# Patient Record
Sex: Male | Born: 1955 | Race: White | Hispanic: No | Marital: Married | Smoking: Former smoker
Health system: Southern US, Community
[De-identification: ages and names within clinical notes are randomized; demographics above are authoritative.]

## PROBLEM LIST (undated history)

## (undated) DIAGNOSIS — N200 Calculus of kidney: Secondary | ICD-10-CM

## (undated) HISTORY — DX: Calculus of kidney: N20.0

## (undated) HISTORY — PX: OTHER SURGICAL HISTORY: SHX169

---

## 2002-05-07 ENCOUNTER — Emergency Department (HOSPITAL_COMMUNITY): Admission: EM | Admit: 2002-05-07 | Discharge: 2002-05-07 | Payer: Self-pay | Admitting: Emergency Medicine

## 2002-05-07 ENCOUNTER — Encounter: Payer: Self-pay | Admitting: Emergency Medicine

## 2007-10-01 ENCOUNTER — Encounter: Admission: RE | Admit: 2007-10-01 | Discharge: 2007-10-01 | Payer: Self-pay | Admitting: General Surgery

## 2008-03-10 ENCOUNTER — Ambulatory Visit: Payer: Self-pay | Admitting: Internal Medicine

## 2008-03-10 DIAGNOSIS — R1032 Left lower quadrant pain: Secondary | ICD-10-CM | POA: Insufficient documentation

## 2008-03-10 HISTORY — PX: FETAL SURGERY FOR CONGENITAL HERNIA: SHX1618

## 2008-03-10 LAB — CONVERTED CEMR LAB
Bacteria, UA: NEGATIVE
Bilirubin Urine: NEGATIVE
Crystals: NEGATIVE
Ketones, ur: NEGATIVE mg/dL
Leukocytes, UA: NEGATIVE
Nitrite: NEGATIVE
PSA: 0.56 ng/mL (ref 0.10–4.00)
Specific Gravity, Urine: 1.025 (ref 1.000–1.03)
Total Protein, Urine: NEGATIVE mg/dL
Urine Glucose: NEGATIVE mg/dL
Urobilinogen, UA: 0.2 (ref 0.0–1.0)
pH: 6 (ref 5.0–8.0)

## 2008-04-11 ENCOUNTER — Encounter: Payer: Self-pay | Admitting: Internal Medicine

## 2008-04-11 ENCOUNTER — Ambulatory Visit: Payer: Self-pay | Admitting: Internal Medicine

## 2008-04-11 DIAGNOSIS — N200 Calculus of kidney: Secondary | ICD-10-CM

## 2008-04-11 HISTORY — DX: Calculus of kidney: N20.0

## 2008-04-13 ENCOUNTER — Encounter: Payer: Self-pay | Admitting: Internal Medicine

## 2009-04-14 ENCOUNTER — Telehealth: Payer: Self-pay | Admitting: Internal Medicine

## 2009-04-18 ENCOUNTER — Ambulatory Visit: Payer: Self-pay | Admitting: Internal Medicine

## 2009-04-18 DIAGNOSIS — K219 Gastro-esophageal reflux disease without esophagitis: Secondary | ICD-10-CM | POA: Insufficient documentation

## 2009-04-18 DIAGNOSIS — R143 Flatulence: Secondary | ICD-10-CM

## 2009-04-18 DIAGNOSIS — R142 Eructation: Secondary | ICD-10-CM

## 2009-04-18 DIAGNOSIS — R1319 Other dysphagia: Secondary | ICD-10-CM | POA: Insufficient documentation

## 2009-04-18 DIAGNOSIS — R141 Gas pain: Secondary | ICD-10-CM | POA: Insufficient documentation

## 2009-04-24 ENCOUNTER — Telehealth: Payer: Self-pay | Admitting: Internal Medicine

## 2009-05-18 ENCOUNTER — Encounter: Payer: Self-pay | Admitting: Internal Medicine

## 2010-06-12 NOTE — Consult Note (Signed)
[facility]   Imported By: Sherian Rein 05/29/2009 10:01:54  _____________________________________________________________________  External Attachment:    Type:   Image     Comment:   External Document

## 2010-08-09 ENCOUNTER — Other Ambulatory Visit: Payer: Self-pay | Admitting: Pediatrics

## 2010-08-09 DIAGNOSIS — J331 Polypoid sinus degeneration: Secondary | ICD-10-CM

## 2010-08-10 ENCOUNTER — Ambulatory Visit
Admission: RE | Admit: 2010-08-10 | Discharge: 2010-08-10 | Disposition: A | Discharge status: Home / Self Care | Payer: 59 | Source: Ambulatory Visit | Attending: Pediatrics | Admitting: Pediatrics

## 2010-08-10 DIAGNOSIS — J331 Polypoid sinus degeneration: Secondary | ICD-10-CM

## 2010-08-15 ENCOUNTER — Other Ambulatory Visit: Payer: Self-pay | Admitting: Otolaryngology

## 2011-07-06 ENCOUNTER — Other Ambulatory Visit: Payer: Self-pay

## 2011-07-06 ENCOUNTER — Emergency Department (HOSPITAL_COMMUNITY)
Admission: EM | Admit: 2011-07-06 | Discharge: 2011-07-06 | Disposition: A | Discharge status: Home / Self Care | Payer: 59 | Attending: Emergency Medicine | Admitting: Emergency Medicine

## 2011-07-06 ENCOUNTER — Encounter (HOSPITAL_COMMUNITY): Payer: Self-pay | Admitting: Adult Health

## 2011-07-06 ENCOUNTER — Emergency Department (HOSPITAL_COMMUNITY): Payer: 59

## 2011-07-06 DIAGNOSIS — R0989 Other specified symptoms and signs involving the circulatory and respiratory systems: Secondary | ICD-10-CM | POA: Insufficient documentation

## 2011-07-06 DIAGNOSIS — R0602 Shortness of breath: Secondary | ICD-10-CM | POA: Insufficient documentation

## 2011-07-06 DIAGNOSIS — R002 Palpitations: Secondary | ICD-10-CM

## 2011-07-06 DIAGNOSIS — R0609 Other forms of dyspnea: Secondary | ICD-10-CM | POA: Insufficient documentation

## 2011-07-06 LAB — BASIC METABOLIC PANEL
BUN: 15 mg/dL (ref 6–23)
Calcium: 9.6 mg/dL (ref 8.4–10.5)
GFR calc Af Amer: >90 mL/min (ref >90)
GFR calc non Af Amer: >90 mL/min (ref >90)
Glucose, Bld: 105 mg/dL — ABNORMAL HIGH (ref 70–99)
Potassium: 3.5 mEq/L (ref 3.5–5.1)
Sodium: 138 mEq/L (ref 135–145)

## 2011-07-06 LAB — CBC
MCH: 30.3 pg (ref 26.0–34.0)
MCHC: 35 g/dL (ref 30.0–36.0)
RDW: 12.8 % (ref 11.5–15.5)

## 2011-07-06 LAB — POCT I-STAT TROPONIN I

## 2011-07-06 LAB — TROPONIN I: Troponin I: <0.3 ng/mL (ref <0.30)

## 2011-07-06 LAB — GLUCOSE, CAPILLARY: Glucose-Capillary: 104 mg/dL — ABNORMAL HIGH (ref 70–99)

## 2011-07-06 MED ORDER — ALBUTEROL SULFATE HFA 108 (90 BASE) MCG/ACT IN AERS
1.0000 | INHALATION_SPRAY | RESPIRATORY_TRACT | Status: DC
  Administered 2011-07-06: 2 via RESPIRATORY_TRACT
  Filled 2011-07-06: qty 6.7

## 2011-07-06 NOTE — ED Notes (Signed)
Patient transported to X-ray via stretcher accompanied by x-ray tech

## 2011-07-06 NOTE — ED Provider Notes (Signed)
History     CSN: [id number]  Arrival date & time 07/06/11  1455   First MD Initiated Contact with Patient 07/06/11 1500      Chief Complaint  Patient presents with  . Shortness of Breath     HPI Patient presents following episode of shakiness, palpitations, mild dyspnea.  He notes that over the past month he has had mild, persistent, productive cough with associated minimal dyspnea.  One week ago he had one episode that was similar to today's events.  That episode resolved spontaneously after a few moments.  Today he notes that he was walking, when he gradually became aware of palpitations, generalized sense of being unwell.  Concurrently he had mild dyspnea.  Symptoms resolved spontaneously after a few moments.  No exertional exacerbation, no pain, no near syncope, no vomiting, no ataxia, no weakness. The no fevers, no chills, no abdominal pain, no confusion, no lower extremity edema. History reviewed. No pertinent past medical history.  History reviewed. No pertinent past surgical history.  History reviewed. No pertinent family history.  History  Substance Use Topics  . Smoking status: Former Games developer  . Smokeless tobacco: Not on file  . Alcohol Use: No      Review of Systems  Constitutional:       Per HPI, otherwise negative  HENT:       Per HPI, otherwise negative  Eyes: Negative.   Respiratory:       Per HPI, otherwise negative  Cardiovascular:       Per HPI, otherwise negative  Gastrointestinal: Negative for vomiting.  Genitourinary: Negative.   Musculoskeletal:       Per HPI, otherwise negative  Skin: Negative.   Neurological: Negative for syncope.    Allergies  Review of patient's allergies indicates no known allergies.  Home Medications  No current outpatient prescriptions on file.  BP 149/89  Pulse 90  Temp 97.9 F (36.6 C)  Resp 22  Wt 180 lb (81.647 kg)  SpO2 99%  Physical Exam  Nursing note and vitals reviewed. Constitutional: He is  oriented to person, place, and time. He appears well-developed. No distress.  HENT:  Head: Normocephalic and atraumatic.  Eyes: Conjunctivae and EOM are normal.  Cardiovascular: Normal rate and regular rhythm.   Pulmonary/Chest: Effort normal. No stridor. No respiratory distress.  Abdominal: He exhibits no distension.  Musculoskeletal: He exhibits no edema.  Neurological: He is alert and oriented to person, place, and time.  Skin: Skin is warm and dry.  Psychiatric: He has a normal mood and affect.    ED Course  Procedures (including critical care time)  Labs Reviewed  GLUCOSE, CAPILLARY - Abnormal; Notable for the following:    Glucose-Capillary 104 (*)    All other components within normal limits  CBC  BASIC METABOLIC PANEL   No results found.   No diagnosis found.  Pulse oximetry 100% room air normal Monitor 85 sinus rhythm normal Chest x-ray was reviewed by me    Date: 07/06/2011  Rate: 91  Rhythm: normal sinus rhythm  QRS Axis: normal  Intervals: normal  ST/T Wave abnormalities: normal  Conduction Disutrbances:none  Narrative Interpretation:   Old EKG Reviewed: none available NORMAL  R/O ACS vs. PNA, low suspicion for PE.  Sx likely bronchitis  7:50 PM Patient w no new complaints.  Repeat Trop Negative. MDM  This generally well-appearing 56 year old male presents with renal episode of palpitations and generalized sense of being unwell.  Notably the patient has a history  of cough over the past month.  On exam he is in no distress with unremarkable vital signs.  The patient's denial of exertional or pleuritic pain, the absence of any signs of distress, or abnormal vital signs is a reassuring for the low probability of this being cardiac etiology.  Patient has no risk factors for PE and is not hypoxic.  Given the patient's age, he had serial troponins drawn, multiple negative results are concordant with the remainder of the patient's evaluation which is reassuring  for the very low suspicion of cardiac etiology.  Given the lifelong cough, there is poor suspicion of viral infection or bronchitis.  All findings were discussed with the patient and his wife.  The patient was discharged in stable condition to follow up with his primary care physician in 2 days        Gerhard Munch, MD 07/06/11 [id number]

## 2011-07-06 NOTE — Discharge Instructions (Signed)
Your emergency department evaluation tonight did not demonstrate a specific cause of your complaints.  Although your symptoms are most consistent with either a viral illness or bronchitis, there is need to continue for evaluation with your primary care physician.  Please make sure to call in 2 days to insure appropriate followup care and evaluation.   If you develop any new, or concerning changes in your condition prior to that evaluation, please return to emergency department immediately.

## 2011-07-06 NOTE — ED Notes (Signed)
While walking at [place] pt became shaky, began feeling short of breath and had an overall feeling that something was not right.  Denies chest pain. This has happened one week ago and resolved on own.

## 2011-07-06 NOTE — ED Notes (Signed)
Pt reports 4 week history of intermittent feelings of anxiety, cough with production of green sputum, and generally feeling unwell.  Reports waking up today with the same feelings, but more intense.  Reports the feelings subsided, he went out shopping with his wife and suddenly felt anxious and felt that he had to come to see a doctor right away.  Pt denies fever, denies pain, denies shortness of breath, no dizziness, no nausea, no diaphoresis

## 2011-07-06 NOTE — ED Notes (Signed)
MD at bedside. 

## 2011-07-06 NOTE — ED Notes (Signed)
Continuous cardiac monitor applied showing normal sinus rhythm, no ectopy Continuous pulse oximetry applied. Automatic BP cuff applied.  

## 2012-04-22 ENCOUNTER — Other Ambulatory Visit: Payer: Self-pay | Admitting: Neurosurgery

## 2012-04-22 DIAGNOSIS — M542 Cervicalgia: Secondary | ICD-10-CM

## 2012-04-22 DIAGNOSIS — M5410 Radiculopathy, site unspecified: Secondary | ICD-10-CM

## 2012-05-01 ENCOUNTER — Ambulatory Visit
Admission: RE | Admit: 2012-05-01 | Discharge: 2012-05-01 | Disposition: A | Discharge status: Home / Self Care | Payer: 59 | Source: Ambulatory Visit | Attending: Neurosurgery | Admitting: Neurosurgery

## 2012-05-01 VITALS — BP 157/92 | HR 105

## 2012-05-01 DIAGNOSIS — M5410 Radiculopathy, site unspecified: Secondary | ICD-10-CM

## 2012-05-01 DIAGNOSIS — M542 Cervicalgia: Secondary | ICD-10-CM

## 2012-05-01 MED ORDER — DIAZEPAM 5 MG PO TABS
10.0000 mg | ORAL_TABLET | Freq: Once | ORAL | Status: AC
  Administered 2012-05-01: 10 mg via ORAL

## 2012-05-01 MED ORDER — IOHEXOL 300 MG/ML  SOLN
10.0000 mL | Freq: Once | INTRAMUSCULAR | Status: AC | PRN
  Administered 2012-05-01: 10 mL via INTRATHECAL

## 2012-08-14 ENCOUNTER — Encounter: Payer: Self-pay | Admitting: *Deleted

## 2012-08-14 ENCOUNTER — Telehealth: Payer: Self-pay | Admitting: Internal Medicine

## 2012-08-14 NOTE — Telephone Encounter (Signed)
Pt states he has had some bright red blood. Pt concerned about this, scheduled pt to see Mike Gip PA Monday 08/17/12@1 :30pm. Pt will try some OTC hemorrhoid medications and call us back Monday and cancel the appt if he decides he does not need to be seen. Pt aware of appt date and time.

## 2012-08-17 ENCOUNTER — Ambulatory Visit: Payer: 59 | Admitting: Physician Assistant

## 2012-11-20 ENCOUNTER — Other Ambulatory Visit (INDEPENDENT_AMBULATORY_CARE_PROVIDER_SITE_OTHER): Payer: 59

## 2012-11-20 ENCOUNTER — Encounter: Payer: Self-pay | Admitting: Physician Assistant

## 2012-11-20 ENCOUNTER — Ambulatory Visit (INDEPENDENT_AMBULATORY_CARE_PROVIDER_SITE_OTHER): Payer: 59 | Admitting: Nurse Practitioner

## 2012-11-20 ENCOUNTER — Telehealth: Payer: Self-pay | Admitting: Internal Medicine

## 2012-11-20 ENCOUNTER — Encounter: Payer: Self-pay | Admitting: Nurse Practitioner

## 2012-11-20 VITALS — BP 130/80 | HR 88 | Ht 70.0 in | Wt 182.0 lb

## 2012-11-20 DIAGNOSIS — K649 Unspecified hemorrhoids: Secondary | ICD-10-CM | POA: Insufficient documentation

## 2012-11-20 DIAGNOSIS — R1319 Other dysphagia: Secondary | ICD-10-CM

## 2012-11-20 DIAGNOSIS — K625 Hemorrhage of anus and rectum: Secondary | ICD-10-CM | POA: Insufficient documentation

## 2012-11-20 DIAGNOSIS — R141 Gas pain: Secondary | ICD-10-CM

## 2012-11-20 DIAGNOSIS — R109 Unspecified abdominal pain: Secondary | ICD-10-CM | POA: Insufficient documentation

## 2012-11-20 LAB — CBC WITH DIFFERENTIAL/PLATELET
Basophils Absolute: 0 10*3/uL (ref 0.0–0.1)
Basophils Relative: 0.3 % (ref 0.0–3.0)
Eosinophils Absolute: 0.1 10*3/uL (ref 0.0–0.7)
Eosinophils Relative: 0.9 % (ref 0.0–5.0)
HCT: 45.5 % (ref 39.0–52.0)
Hemoglobin: 15.5 g/dL (ref 13.0–17.0)
Lymphocytes Relative: 17.1 % (ref 12.0–46.0)
Lymphs Abs: 1.7 10*3/uL (ref 0.7–4.0)
MCHC: 34 g/dL (ref 30.0–36.0)
MCV: 88.9 fl (ref 78.0–100.0)
Monocytes Absolute: 0.5 10*3/uL (ref 0.1–1.0)
Monocytes Relative: 5.3 % (ref 3.0–12.0)
Neutro Abs: 7.7 10*3/uL (ref 1.4–7.7)
Neutrophils Relative %: 76.4 % (ref 43.0–77.0)
Platelets: 252 10*3/uL (ref 150.0–400.0)
RBC: 5.12 Mil/uL (ref 4.22–5.81)
RDW: 13 % (ref 11.5–14.6)
WBC: 10 10*3/uL (ref 4.5–10.5)

## 2012-11-20 MED ORDER — OMEPRAZOLE 20 MG PO CPDR: 20.0000 mg | DELAYED_RELEASE_CAPSULE | Freq: Every day | ORAL | Status: DC

## 2012-11-20 MED ORDER — HYDROCORTISONE ACETATE 25 MG RE SUPP: 25.0000 mg | Freq: Two times a day (BID) | RECTAL | Status: DC

## 2012-11-20 NOTE — Patient Instructions (Addendum)
Please go to the basement level to have your labs drawn.  We will notify you with the test results. Discontinue Motrin for now. Begin Prilosec 30 minutes before breakfast and 30 minutes before dinner  Anusol HC suppositories one every night for 7 days  We made you an appointment with Dr. Marina Goodell on 12-16-2012 at 8:30 am. .  Call office ASAP for black stool, increasing abdominal pain, nausea/ vomiting.

## 2012-11-20 NOTE — Telephone Encounter (Signed)
Patient reports rectal bleeding and "irratable bowel".  He will come in and see Willette Cluster RNP today at 1:30

## 2012-11-20 NOTE — Progress Notes (Signed)
HPI :  Patient is a 57 year old male known to Dr. Marina Goodell. He has a family history colon cancer in his father. Patient's last colonoscopy was November 2009 done for abdominal pain, bloating and his family history. Findings included a colon polyp and small internal hemorrhoids. Polyp pathology compatible with hyperplastic polyp. Surveillance colonoscopy D. November 2014.  Patient presents today for evaluation of rectal bleeding. He has had bright red blood with bowel movements for several days despite Preparation H. In addition, patient mentions a two-week history of loading and they mid abdominal discomfort. He reports passage of a black stool a couple of weeks ago (no bismuth or iron). No nausea. He does take Motrin about twice a week. No PPIs. He occasionally has difficulty swallowing steak. He seems to do okay if steak is cut up into small pieces. Rarely has heartburn.  Past Medical History  Diagnosis Date  . Colon polyps 04/11/2008    Ileocecal valve and hyperplastic (2)  . Nephrolithiasis    Family History  Problem Relation Age of Onset  . Colon cancer Father   . Prostate cancer Father   . Cervical cancer Mother    History  Substance Use Topics  . Smoking status: Former Games developer  . Smokeless tobacco: Never Used  . Alcohol Use: Yes   No current outpatient prescriptions on file.   No current facility-administered medications for this visit.   No Known Allergies   Review of Systems: All systems reviewed and negative except where noted in HPI.   Physical Exam: BP 130/80  Pulse 88  Ht 5\' 10"  (1.778 m)  Wt 182 lb (82.555 kg)  BMI 26.11 kg/m2 Constitutional: Pleasant,well-developed, white male in no acute distress. HEENT: Normocephalic and atraumatic. Conjunctivae are normal. No scleral icterus. Neck supple.  Cardiovascular: Normal rate, regular rhythm.  Pulmonary/chest: Effort normal and breath sounds normal. No wheezing, rales or rhonchi. Abdominal: Soft, nondistended,  nontender. Bowel sounds active throughout. There are no masses palpable. No hepatomegaly. Rectal: Very scant amount of brown, Hemoccult-negative stool. Mildly inflamed internal and external hemorrhoids Extremities: no edema Lymphadenopathy: No cervical adenopathy noted. Neurological: Alert and oriented to person place and time. Skin: Skin is warm and dry. No rashes noted. Psychiatric: Normal mood and affect. Behavior is normal.   ASSESSMENT AND PLAN: 69. 57 year old male presenting with several day history of low volume rectal bleeding with bowel movements. Patient does have internal and external hemorrhoids on exam. Suspect bleeding is secondary to hemorrhoids and will treat with steroid suppositories at night for one week.  2. two-week history of abdominal bloating, vague mid abdominal discomfort. Patient reports passage of a black stool couple weeks good. He is Hemoccult-negative today, stool is brown. Patient does use NSAIDs twice a week. He does not take a PPI. For now patient will discontinue NSAIDs, start twice daily PPI. Obtain a CBC. Will call patient with test results. He will followup in clinic with myself or Dr. Marina Goodell in 2 weeks but knows to call in the interim for worsening abdominal pain, nausea / vomiting/ or black stools.   3. [facility] of colon cancer in father. Surveillance colonoscopy due this November. Marland Kitchen

## 2012-11-23 NOTE — Progress Notes (Signed)
Agree with initial assessment and plans 

## 2012-12-14 ENCOUNTER — Ambulatory Visit: Payer: 59 | Admitting: Internal Medicine

## 2012-12-16 ENCOUNTER — Ambulatory Visit: Payer: 59 | Admitting: Internal Medicine

## 2012-12-28 ENCOUNTER — Encounter: Payer: Self-pay | Admitting: Internal Medicine

## 2012-12-28 ENCOUNTER — Ambulatory Visit (INDEPENDENT_AMBULATORY_CARE_PROVIDER_SITE_OTHER): Payer: 59 | Admitting: Internal Medicine

## 2012-12-28 VITALS — BP 120/70 | HR 70 | Ht 70.0 in | Wt 190.4 lb

## 2012-12-28 DIAGNOSIS — R109 Unspecified abdominal pain: Secondary | ICD-10-CM

## 2012-12-28 DIAGNOSIS — Z800 Family history of malignant neoplasm of digestive organs: Secondary | ICD-10-CM

## 2012-12-28 DIAGNOSIS — K625 Hemorrhage of anus and rectum: Secondary | ICD-10-CM

## 2012-12-28 MED ORDER — HYDROCORTISONE ACETATE 25 MG RE SUPP: 25.0000 mg | Freq: Two times a day (BID) | RECTAL | Status: DC

## 2012-12-28 MED ORDER — MOVIPREP 100 G PO SOLR: 1.0000 | Freq: Once | ORAL | Status: DC

## 2012-12-28 NOTE — Patient Instructions (Addendum)
You have been scheduled for a colonoscopy with propofol. Please follow written instructions given to you at your visit today.  Please pick up your prep kit at the pharmacy within the next 1-3 days. If you use inhalers (even only as needed), please bring them with you on the day of your procedure. Your physician has requested that you go to www.startemmi.com and enter the access code given to you at your visit today. This web site gives a general overview about your procedure. However, you should still follow specific instructions given to you by our office regarding your preparation for the procedure.   We have sent the following medications to your pharmacy for you to pick up at your convenience:  Anusol suppositories

## 2012-12-28 NOTE — Progress Notes (Signed)
HISTORY OF PRESENT ILLNESS:  Brandon Woodward is a 57 y.o. male with no significant past medical history who presents today for followup. He was evaluated by the GI nurse practitioner 11/20/2012 for vague abdominal discomfort and minor rectal bleeding. He was found to have internal and external hemorrhoids which were treated with medicated suppositories. Within one week, his bleeding resolved without recurrence. As well, mild abdominal discomfort abated with out recurrence. He presents today for followup as recommended. He didn't have laboratories on July 11. Normal CBC with hemoglobin 15.5. Does have a family history of colon cancer in his father. Index colonoscopy in November 2014 was normal except for a diminutive hyperplastic polyp which was removed and internal hemorrhoids. He is now due for his repeat screening exam. He wishes to set that up as well.  REVIEW OF SYSTEMS:  All non-GI ROS negative   Past Medical History  Diagnosis Date  . Colon polyps 04/11/2008    Ileocecal valve and hyperplastic (2)  . Nephrolithiasis     Past Surgical History  Procedure Laterality Date  . 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 Nose polypectomy      Social History Brandon Woodward  reports that he has quit smoking. He has never used smokeless tobacco. He reports that  drinks alcohol. He reports that he does not use illicit drugs.  family history includes Cervical cancer in his mother; Colon cancer in his father; Prostate cancer in his father.  No Known Allergies     PHYSICAL EXAMINATION:  Vital signs: BP 120/70  Pulse 70  Ht 5\' 10"  (1.778 m)  Wt 190 lb 6.4 oz (86.365 kg)  BMI 27.32 kg/m2 General: Well-developed, well-nourished, no acute distress HEENT: Sclerae are anicteric, conjunctiva pink. Oral mucosa intact Abdomen: Not reexamined. Psychiatric: alert and oriented x3. Cooperative   ASSESSMENT:  #1. Minor rectal bleeding secondary to hemorrhoids.  Resolved with medicated suppositories. He requests medication refill for on demand use if needed #2. Transient vague abdominal discomfort. Resolved #3. Family history of colon cancer due for followup high-risk screening   PLAN:  #1. Refill Anusol suppositories #2. Schedule repeat screening colonoscopy.The nature of the procedure, as well as the risks, benefits, and alternatives were carefully and thoroughly reviewed with the patient. Ample time for discussion and questions allowed. The patient understood, was satisfied, and agreed to proceed. Movi prep prescribed. The patient instructed on its use

## 2013-03-18 ENCOUNTER — Other Ambulatory Visit: Payer: Self-pay

## 2013-04-02 ENCOUNTER — Encounter: Payer: 59 | Admitting: Internal Medicine

## 2013-04-02 ENCOUNTER — Ambulatory Visit (AMBULATORY_SURGERY_CENTER): Payer: 59 | Admitting: Internal Medicine

## 2013-04-02 ENCOUNTER — Encounter: Payer: Self-pay | Admitting: Internal Medicine

## 2013-04-02 VITALS — BP 134/89 | HR 77 | Temp 98.0°F | Resp 18 | Ht 70.0 in | Wt 190.0 lb

## 2013-04-02 DIAGNOSIS — R109 Unspecified abdominal pain: Secondary | ICD-10-CM

## 2013-04-02 DIAGNOSIS — Z800 Family history of malignant neoplasm of digestive organs: Secondary | ICD-10-CM

## 2013-04-02 DIAGNOSIS — Z1211 Encounter for screening for malignant neoplasm of colon: Secondary | ICD-10-CM

## 2013-04-02 DIAGNOSIS — D126 Benign neoplasm of colon, unspecified: Secondary | ICD-10-CM

## 2013-04-02 DIAGNOSIS — K625 Hemorrhage of anus and rectum: Secondary | ICD-10-CM

## 2013-04-02 MED ORDER — HYDROCORTISONE ACETATE 25 MG RE SUPP: 25.0000 mg | Freq: Two times a day (BID) | RECTAL | Status: DC

## 2013-04-02 MED ORDER — SODIUM CHLORIDE 0.9 % IV SOLN: 500.0000 mL | INTRAVENOUS | Status: DC

## 2013-04-02 NOTE — Progress Notes (Signed)
  [facility] Anesthesia Post-op Note  Patient: Brandon Woodward  Procedure(s) Performed: colonoscopy  Patient Location: [facility] - Recovery Area  Anesthesia Type: Deep Sedation/Propofol  Level of Consciousness: awake, oriented and patient cooperative  Airway and Oxygen Therapy: Patient Spontanous Breathing  Post-op Pain: none  Post-op Assessment:  Post-op Vital signs reviewed, Patient's Cardiovascular Status Stable, Respiratory Function Stable, Patent Airway, No signs of Nausea or vomiting and Pain level controlled  Post-op Vital Signs: Reviewed and stable  Complications: No apparent anesthesia complications  Bj Morlock E 8:46 AM

## 2013-04-02 NOTE — Op Note (Signed)
[facility] [address] [place], [address]   COLONOSCOPY PROCEDURE REPORT  PATIENT: Brandon, Woodward.  MR#: [id number] BIRTHDATE: Feb 01, 1956 , 57  yrs. old GENDER: Male ENDOSCOPIST: Roxy Cedar, MD REFERRED KG:[id number] Recall, PROCEDURE DATE:  04/02/2013 PROCEDURE:   Colonoscopy with snare polypectomy First Screening Colonoscopy - Avg.  risk and is 50 yrs.  old or older - No.  Prior Negative Screening - Now for repeat screening. Above average risk  History of Adenoma - Now for follow-up colonoscopy & has been > or = to 3 yrs.  N/A  Polyps Removed Today? Yes. ASA CLASS:   Class I INDICATIONS:Patient's immediate family history of colon cancer (Father 66's). Index exam 03-2008 (tiny hpp o/w normal). MEDICATIONS: MAC sedation, administered by CRNA and propofol (Diprivan) 200mg  IV  DESCRIPTION OF PROCEDURE:   After the risks benefits and alternatives of the procedure were thoroughly explained, informed consent was obtained.  A digital rectal exam revealed no abnormalities of the rectum.   The LB [id number]  endoscope was introduced through the anus and advanced to the cecum, which was identified by both the appendix and ileocecal valve. No adverse events experienced.   The quality of the prep was good, using MoviPrep  The instrument was then slowly withdrawn as the colon was fully examined.   COLON FINDINGS: A diminutive polyp was found in the ascending colon. A polypectomy was performed with a cold snare.  The resection was complete and the polyp tissue was completely retrieved.   The mucosa appeared normal in the terminal ileum.   The colon mucosa was otherwise normal.  Retroflexed views revealed internal hemorrhoids. The time to cecum=1 minutes 45 seconds.  Withdrawal time=10 minutes 28 seconds.  The scope was withdrawn and the procedure completed. COMPLICATIONS: There were no complications.  ENDOSCOPIC IMPRESSION: 1.   Diminutive polyp was  found in the ascending colon; polypectomy was performed with a cold snare 2.   Normal mucosa in the terminal ileum 3.   The colon mucosa was otherwise normal  RECOMMENDATIONS: 1. Follow up colonoscopy in 5 years   eSigned:  Roxy Cedar, MD 04/02/2013 8:49 AM   cc: The Patient    ; Dibas Koirala,MD

## 2013-04-02 NOTE — Progress Notes (Signed)
Called to room to assist during endoscopic procedure.  Patient ID and intended procedure confirmed with present staff. Received instructions for my participation in the procedure from the performing physician. ewm 

## 2013-04-02 NOTE — Progress Notes (Signed)
Patient did not experience any of the following events: a burn prior to discharge; a fall within the facility; wrong site/side/patient/procedure/implant event; or a hospital transfer or hospital admission upon discharge from the facility. (G8907) Patient did not have preoperative order for IV antibiotic SSI prophylaxis. (G8918)  

## 2013-04-02 NOTE — Patient Instructions (Signed)
Polyp Infornation sheet given.  Follow up Colonoscopy in 5 years.  YOU HAD AN ENDOSCOPIC PROCEDURE TODAY AT THE [facility]: Refer to the procedure report that was given to you for any specific questions about what was found during the examination.  If the procedure report does not answer your questions, please call your gastroenterologist to clarify.  If you requested that your care partner not be given the details of your procedure findings, then the procedure report has been included in a sealed envelope for you to review at your convenience later.  YOU SHOULD EXPECT: Some feelings of bloating in the abdomen. Passage of more gas than usual.  Walking can help get rid of the air that was put into your GI tract during the procedure and reduce the bloating. If you had a lower endoscopy (such as a colonoscopy or flexible sigmoidoscopy) you may notice spotting of blood in your stool or on the toilet paper. If you underwent a bowel prep for your procedure, then you may not have a normal bowel movement for a few days.  DIET: Your first meal following the procedure should be a light meal and then it is ok to progress to your normal diet.  A half-sandwich or bowl of soup is an example of a good first meal.  Heavy or fried foods are harder to digest and may make you feel nauseous or bloated.  Likewise meals heavy in dairy and vegetables can cause extra gas to form and this can also increase the bloating.  Drink plenty of fluids but you should avoid alcoholic beverages for 24 hours.  ACTIVITY: Your care partner should take you home directly after the procedure.  You should plan to take it easy, moving slowly for the rest of the day.  You can resume normal activity the day after the procedure however you should NOT DRIVE or use heavy machinery for 24 hours (because of the sedation medicines used during the test).    SYMPTOMS TO REPORT IMMEDIATELY: A gastroenterologist can be reached at any hour.   During normal business hours, 8:30 AM to 5:00 PM Monday through Friday, call [phone].  After hours and on weekends, please call the GI answering service at [phone] who will take a message and have the physician on call contact you.   Following lower endoscopy (colonoscopy or flexible sigmoidoscopy):  Excessive amounts of blood in the stool  Significant tenderness or worsening of abdominal pains  Swelling of the abdomen that is new, acute  Fever of 100F or higher  FOLLOW UP: If any biopsies were taken you will be contacted by phone or by letter within the next 1-3 weeks.  Call your gastroenterologist if you have not heard about the biopsies in 3 weeks.  Our staff will call the home number listed on your records the next business day following your procedure to check on you and address any questions or concerns that you may have at that time regarding the information given to you following your procedure. This is a courtesy call and so if there is no answer at the home number and we have not heard from you through the emergency physician on call, we will assume that you have returned to your regular daily activities without incident.  SIGNATURES/CONFIDENTIALITY: You and/or your care partner have signed paperwork which will be entered into your electronic medical record.  These signatures attest to the fact that that the information above on your After Visit Summary has been  reviewed and is understood.  Full responsibility of the confidentiality of this discharge information lies with you and/or your care-partner. 

## 2013-04-05 ENCOUNTER — Telehealth: Payer: Self-pay | Admitting: *Deleted

## 2013-04-05 NOTE — Telephone Encounter (Signed)
  Follow up Call-  Call back number 04/02/2013  Post procedure Call Back phone  # [phone]  Permission to leave phone message Yes     Patient questions:  Do you have a fever, pain , or abdominal swelling? no Pain Score  0 *  Have you tolerated food without any problems? yes  Have you been able to return to your normal activities? yes  Do you have any questions about your discharge instructions: Diet   no Medications  no Follow up visit  no  Do you have questions or concerns about your Care? no  Actions: * If pain score is 4 or above: No action needed, pain <4.

## 2013-04-06 ENCOUNTER — Encounter: Payer: Self-pay | Admitting: Internal Medicine

## 2014-01-03 ENCOUNTER — Other Ambulatory Visit: Payer: Self-pay | Admitting: Family Medicine

## 2014-01-03 DIAGNOSIS — IMO0002 Reserved for concepts with insufficient information to code with codable children: Secondary | ICD-10-CM

## 2014-01-05 ENCOUNTER — Ambulatory Visit
Admission: RE | Admit: 2014-01-05 | Discharge: 2014-01-05 | Disposition: A | Discharge status: Home / Self Care | Payer: 59 | Source: Ambulatory Visit | Attending: Family Medicine | Admitting: Family Medicine

## 2014-01-05 DIAGNOSIS — IMO0002 Reserved for concepts with insufficient information to code with codable children: Secondary | ICD-10-CM

## 2015-02-27 ENCOUNTER — Encounter: Payer: Self-pay | Admitting: Internal Medicine

## 2015-07-14 ENCOUNTER — Other Ambulatory Visit: Payer: Self-pay | Admitting: Internal Medicine

## 2016-07-30 DIAGNOSIS — M47812 Spondylosis without myelopathy or radiculopathy, cervical region: Secondary | ICD-10-CM | POA: Diagnosis not present

## 2016-11-05 DIAGNOSIS — M47812 Spondylosis without myelopathy or radiculopathy, cervical region: Secondary | ICD-10-CM | POA: Diagnosis not present

## 2017-02-21 DIAGNOSIS — H40013 Open angle with borderline findings, low risk, bilateral: Secondary | ICD-10-CM | POA: Diagnosis not present

## 2017-02-21 DIAGNOSIS — H2513 Age-related nuclear cataract, bilateral: Secondary | ICD-10-CM | POA: Diagnosis not present

## 2017-02-21 DIAGNOSIS — D3131 Benign neoplasm of right choroid: Secondary | ICD-10-CM | POA: Diagnosis not present

## 2017-03-13 DIAGNOSIS — J0100 Acute maxillary sinusitis, unspecified: Secondary | ICD-10-CM | POA: Diagnosis not present

## 2017-03-13 DIAGNOSIS — E7800 Pure hypercholesterolemia, unspecified: Secondary | ICD-10-CM | POA: Diagnosis not present

## 2017-03-13 DIAGNOSIS — Z0000 Encounter for general adult medical examination without abnormal findings: Secondary | ICD-10-CM | POA: Diagnosis not present

## 2017-06-09 DIAGNOSIS — L570 Actinic keratosis: Secondary | ICD-10-CM | POA: Diagnosis not present

## 2017-06-09 DIAGNOSIS — L821 Other seborrheic keratosis: Secondary | ICD-10-CM | POA: Diagnosis not present

## 2017-10-13 DIAGNOSIS — M47812 Spondylosis without myelopathy or radiculopathy, cervical region: Secondary | ICD-10-CM | POA: Diagnosis not present

## 2017-12-01 DIAGNOSIS — L570 Actinic keratosis: Secondary | ICD-10-CM | POA: Diagnosis not present

## 2017-12-01 DIAGNOSIS — L821 Other seborrheic keratosis: Secondary | ICD-10-CM | POA: Diagnosis not present

## 2017-12-01 DIAGNOSIS — L812 Freckles: Secondary | ICD-10-CM | POA: Diagnosis not present

## 2018-02-16 DIAGNOSIS — Z23 Encounter for immunization: Secondary | ICD-10-CM | POA: Diagnosis not present

## 2018-03-24 DIAGNOSIS — H2513 Age-related nuclear cataract, bilateral: Secondary | ICD-10-CM | POA: Diagnosis not present

## 2018-03-24 DIAGNOSIS — D3131 Benign neoplasm of right choroid: Secondary | ICD-10-CM | POA: Diagnosis not present

## 2018-03-24 DIAGNOSIS — H40013 Open angle with borderline findings, low risk, bilateral: Secondary | ICD-10-CM | POA: Diagnosis not present

## 2018-03-31 DIAGNOSIS — Z0000 Encounter for general adult medical examination without abnormal findings: Secondary | ICD-10-CM | POA: Diagnosis not present

## 2018-03-31 DIAGNOSIS — E7800 Pure hypercholesterolemia, unspecified: Secondary | ICD-10-CM | POA: Diagnosis not present

## 2018-03-31 DIAGNOSIS — I10 Essential (primary) hypertension: Secondary | ICD-10-CM | POA: Diagnosis not present

## 2018-04-24 DIAGNOSIS — M25561 Pain in right knee: Secondary | ICD-10-CM | POA: Diagnosis not present

## 2018-04-24 DIAGNOSIS — S83411A Sprain of medial collateral ligament of right knee, initial encounter: Secondary | ICD-10-CM | POA: Diagnosis not present

## 2018-05-03 ENCOUNTER — Encounter: Payer: Self-pay | Admitting: Internal Medicine

## 2018-09-08 DIAGNOSIS — M25561 Pain in right knee: Secondary | ICD-10-CM | POA: Diagnosis not present

## 2020-08-11 ENCOUNTER — Encounter: Payer: Self-pay | Admitting: Internal Medicine

## 2020-10-16 ENCOUNTER — Encounter: Payer: Self-pay | Admitting: Internal Medicine

## 2020-11-02 ENCOUNTER — Encounter: Payer: Self-pay | Admitting: Internal Medicine

## 2021-01-19 ENCOUNTER — Other Ambulatory Visit: Payer: Self-pay

## 2021-01-19 ENCOUNTER — Ambulatory Visit (AMBULATORY_SURGERY_CENTER): Payer: Self-pay | Admitting: *Deleted

## 2021-01-19 VITALS — Ht 70.0 in | Wt 191.0 lb

## 2021-01-19 DIAGNOSIS — Z86010 Personal history of colonic polyps: Secondary | ICD-10-CM

## 2021-01-19 DIAGNOSIS — Z800 Family history of malignant neoplasm of digestive organs: Secondary | ICD-10-CM

## 2021-01-19 MED ORDER — SUTAB 1479-225-188 MG PO TABS: 1.0000 | ORAL_TABLET | ORAL | 0 refills | Status: DC

## 2021-01-19 NOTE — Progress Notes (Signed)
Patient is here in-person for PV. Patient denies any allergies to eggs or soy. Patient denies any problems with anesthesia/sedation. Patient is not on any oxygen at home. Patient is not taking any diet/weight loss medications or blood thinners. Patient is aware of our care-partner policy and [id number] safety protocol.   EMMI education assigned to the patient for the procedure, sent to [place].   Patient is COVID-19 vaccinated.  sutab Prep Prescription coupon was given to the patient.

## 2021-01-24 ENCOUNTER — Encounter: Payer: Self-pay | Admitting: Internal Medicine

## 2021-02-02 ENCOUNTER — Encounter: Payer: Self-pay | Admitting: Internal Medicine

## 2021-02-02 ENCOUNTER — Other Ambulatory Visit: Payer: Self-pay

## 2021-02-02 ENCOUNTER — Ambulatory Visit (AMBULATORY_SURGERY_CENTER): Payer: 59 | Admitting: Internal Medicine

## 2021-02-02 VITALS — BP 129/74 | HR 72 | Temp 98.6°F | Resp 10 | Ht 70.0 in | Wt 191.0 lb

## 2021-02-02 DIAGNOSIS — Z86010 Personal history of colonic polyps: Secondary | ICD-10-CM | POA: Diagnosis present

## 2021-02-02 DIAGNOSIS — D120 Benign neoplasm of cecum: Secondary | ICD-10-CM

## 2021-02-02 DIAGNOSIS — K635 Polyp of colon: Secondary | ICD-10-CM

## 2021-02-02 DIAGNOSIS — D122 Benign neoplasm of ascending colon: Secondary | ICD-10-CM

## 2021-02-02 MED ORDER — SODIUM CHLORIDE 0.9 % IV SOLN: 500.0000 mL | Freq: Once | INTRAVENOUS | Status: DC

## 2021-02-02 NOTE — Op Note (Signed)
[name] Procedure Date: 02/02/2021 9:20 AM MRN: [id number] Endoscopist: Docia Chuck. Henrene Pastor , MD Age: 65 Referring MD:  Date of Birth: 17-Jun-1955 Gender: Male Account #: [id number] Procedure:                Colonoscopy with cold snare polypectomy x 4; with                            biopsy Indications:              Screening in patient at increased risk: Colorectal                            cancer in father before age 3. Previous                            examinations 2009, 2014 were both negative for                            neoplasia. Medicines:                Monitored Anesthesia Care Procedure:                Pre-Anesthesia Assessment:                           - Prior to the procedure, a History and Physical                            was performed, and patient medications and                            allergies were reviewed. The patient's tolerance of                            previous anesthesia was also reviewed. The risks                            and benefits of the procedure and the sedation                            options and risks were discussed with the patient.                            All questions were answered, and informed consent                            was obtained. Prior Anticoagulants: The patient has                            taken no previous anticoagulant or antiplatelet                            agents. ASA Grade Assessment: II - A patient with  mild systemic disease. After reviewing the risks                            and benefits, the patient was deemed in                            satisfactory condition to undergo the procedure.                           After obtaining informed consent, the colonoscope                            was passed under direct vision. Throughout the                            procedure, the patient's blood pressure, pulse, and                             oxygen saturations were monitored continuously. The                            Olympus CF-HQ190L [id number]) Colonoscope was                            introduced through the anus and advanced to the the                            cecum, identified by appendiceal orifice and                            ileocecal valve. The ileocecal valve, appendiceal                            orifice, and rectum were photographed. The quality                            of the bowel preparation was good. The colonoscopy                            was performed without difficulty. The patient                            tolerated the procedure well. The bowel preparation                            used was SUPREP via split dose instruction. Scope In: 9:41:51 AM Scope Out: 9:58:43 AM Scope Withdrawal Time: 0 hours 13 minutes 36 seconds  Total Procedure Duration: 0 hours 16 minutes 52 seconds  Findings:                 Four polyps were found in the ascending colon and                            cecum. The polyps were 1 to 4  mm in size. These                            polyps were removed with a cold snare. Resection                            and retrieval were complete.                           A 1 mm nodule was found in the ascending colon.                            Biopsies were taken with a cold forceps for                            histology.                           The exam was otherwise without abnormality on                            direct and retroflexion views. All internal                            hemorrhoids present. Complications:            No immediate complications. Estimated blood loss:                            None. Estimated Blood Loss:     Estimated blood loss: none. Impression:               - Four 1 to 4 mm polyps in the ascending colon and                            in the cecum, removed with a cold snare. Resected                            and retrieved.                            - One 1 mm nodule in the ascending colon. Biopsied.                           - The examination was otherwise normal on direct                            and retroflexion views. Recommendation:           - Repeat colonoscopy in 5 years for surveillance.                           - Patient has a contact number available for                            emergencies. The signs and symptoms of  potential                            delayed complications were discussed with the                            patient. Return to normal activities tomorrow.                            Written discharge instructions were provided to the                            patient.                           - Resume previous diet.                           - Continue present medications.                           - Await pathology results. Docia Chuck. Henrene Pastor, MD 02/02/2021 10:07:00 AM This report has been signed electronically.

## 2021-02-02 NOTE — Patient Instructions (Signed)
Handouts on hemorrhoids and polyps given to patient.  Await pathology results. Repeat colonoscopy in 5 years for surveillance.   YOU HAD AN ENDOSCOPIC PROCEDURE TODAY AT [facility]:   Refer to the procedure report that was given to you for any specific questions about what was found during the examination.  If the procedure report does not answer your questions, please call your gastroenterologist to clarify.  If you requested that your care partner not be given the details of your procedure findings, then the procedure report has been included in a sealed envelope for you to review at your convenience later.  YOU SHOULD EXPECT: Some feelings of bloating in the abdomen. Passage of more gas than usual.  Walking can help get rid of the air that was put into your GI tract during the procedure and reduce the bloating. If you had a lower endoscopy (such as a colonoscopy or flexible sigmoidoscopy) you may notice spotting of blood in your stool or on the toilet paper. If you underwent a bowel prep for your procedure, you may not have a normal bowel movement for a few days.  Please Note:  You might notice some irritation and congestion in your nose or some drainage.  This is from the oxygen used during your procedure.  There is no need for concern and it should clear up in a day or so.  SYMPTOMS TO REPORT IMMEDIATELY:  Following lower endoscopy (colonoscopy or flexible sigmoidoscopy):  Excessive amounts of blood in the stool  Significant tenderness or worsening of abdominal pains  Swelling of the abdomen that is new, acute  Fever of 100F or higher  For urgent or emergent issues, a gastroenterologist can be reached at any hour by calling [phone]. Do not use MyChart messaging for urgent concerns.    DIET:  We do recommend a small meal at first, but then you may proceed to your regular diet.  Drink plenty of fluids but you should avoid alcoholic beverages for 24  hours.  ACTIVITY:  You should plan to take it easy for the rest of today and you should NOT DRIVE or use heavy machinery until tomorrow (because of the sedation medicines used during the test).    FOLLOW UP: Our staff will call the number listed on your records 48-72 hours following your procedure to check on you and address any questions or concerns that you may have regarding the information given to you following your procedure. If we do not reach you, we will leave a message.  We will attempt to reach you two times.  During this call, we will ask if you have developed any symptoms of COVID 19. If you develop any symptoms (ie: fever, flu-like symptoms, shortness of breath, cough etc.) before then, please call [phone].  If you test positive for Covid 19 in the 2 weeks post procedure, please call and report this information to [place].    If any biopsies were taken you will be contacted by phone or by letter within the next 1-3 weeks.  Please call us at [phone] if you have not heard about the biopsies in 3 weeks.    SIGNATURES/CONFIDENTIALITY: You and/or your care partner have signed paperwork which will be entered into your electronic medical record.  These signatures attest to the fact that that the information above on your After Visit Summary has been reviewed and is understood.  Full responsibility of the confidentiality of this discharge information lies with you and/or your  care-partner.  

## 2021-02-02 NOTE — Progress Notes (Signed)
HISTORY OF PRESENT ILLNESS:  Brandon Woodward is a 65 y.o. male who presents today for follow-up screening colonoscopy due to family history of colon cancer in his father, listed age 32.  Previous examinations 2009 and 2014 were both negative for neoplasia.  He has no active GI symptoms  REVIEW OF SYSTEMS:  All non-GI ROS negative.  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 His smoking use included cigarettes. He has never used smokeless tobacco. He reports current alcohol use of about 4.0 standard drinks per week. He reports that he does not use drugs.  family history includes Cervical cancer in his mother; Colon cancer (age of onset: 70) in his father; Prostate cancer in his father.  No Known Allergies     PHYSICAL EXAMINATION:  Vital signs: BP 110/73   Pulse 77   Temp 98.6 F (37 C) (Temporal)   Resp 16   Ht 5\' 10"  (1.778 m)   Wt 191 lb (86.6 kg)   SpO2 97%   BMI 27.41 kg/m  General: Well-developed, well-nourished, no acute distress HEENT: Sclerae are anicteric, conjunctiva pink. Oral mucosa intact Lungs: Clear Heart: Regular Abdomen: soft, nontender, nondistended, no obvious ascites, no peritoneal signs, normal bowel sounds. No organomegaly. Extremities: No edema Psychiatric: alert and oriented x3. Cooperative     ASSESSMENT:  1.  High risk colorectal neoplasia screening due to family history   PLAN:   1.  Screening colonoscopy

## 2021-02-02 NOTE — Progress Notes (Signed)
PT taken to PACU. Monitors in place. VSS. Report given to RN. 

## 2021-02-02 NOTE — Progress Notes (Signed)
Pt's states no medical or surgical changes since previsit or office visit. 

## 2021-02-02 NOTE — Addendum Note (Signed)
Addended by: Arville Lime on: 02/02/2021 11:28 AM   Modules accepted: Orders

## 2021-02-02 NOTE — Progress Notes (Signed)
Called to room to assist during endoscopic procedure.  Patient ID and intended procedure confirmed with present staff. Received instructions for my participation in the procedure from the performing physician.  

## 2021-02-06 ENCOUNTER — Telehealth: Payer: Self-pay

## 2021-02-06 ENCOUNTER — Encounter: Payer: Self-pay | Admitting: Internal Medicine

## 2021-02-06 NOTE — Telephone Encounter (Signed)
  Follow up Call-  Call back number 02/02/2021  Post procedure Call Back phone  # [phone]  Permission to leave phone message Yes  Some recent data might be hidden     Patient questions:  Do you have a fever, pain , or abdominal swelling? No. Pain Score  0 *  Have you tolerated food without any problems? Yes.    Have you been able to return to your normal activities? Yes.    Do you have any questions about your discharge instructions: Diet   No. Medications  No. Follow up visit  No.  Do you have questions or concerns about your Care? No.  Actions: * If pain score is 4 or above: No action needed, pain <4.  Have you developed a fever since your procedure? no  2.   Have you had an respiratory symptoms (SOB or cough) since your procedure? no  3.   Have you tested positive for COVID 19 since your procedure no  4.   Have you had any family members/close contacts diagnosed with the COVID 19 since your procedure?  no   If yes to any of these questions please route to Joylene John, RN and Joella Prince, RN

## 2021-02-26 ENCOUNTER — Telehealth: Payer: Self-pay | Admitting: Internal Medicine

## 2021-02-26 NOTE — Telephone Encounter (Signed)
Yes, that is correct. Please set up EGD in [place].  Thanks [employer]. Dr.  Henrene Pastor

## 2021-02-26 NOTE — Telephone Encounter (Signed)
Pt states he has been having some difficulty with food feeling like it is getting caught in his throat. States he had a colon done 3 weeks ago and was told to call back if he wanted to have an EGD done. Pt would like to set this up. Please advise if ok for direct egd with dil in the [place].

## 2021-02-26 NOTE — Telephone Encounter (Signed)
Inbound call from patient stating he has been having trouble swallowing and is requesting to speak to a nurse.  Please advise.

## 2021-02-27 ENCOUNTER — Other Ambulatory Visit: Payer: Self-pay

## 2021-02-27 DIAGNOSIS — R1310 Dysphagia, unspecified: Secondary | ICD-10-CM

## 2021-02-27 NOTE — Telephone Encounter (Signed)
Pt scheduled for EGD in the [facility] 03/07/21. Pt to arrive here at 8:30am, appt at 9:30am. Pt knows to be NPO after midnight. Ambulatory referral in epic.

## 2021-02-28 ENCOUNTER — Encounter: Payer: Self-pay | Admitting: Internal Medicine

## 2021-03-07 ENCOUNTER — Encounter: Payer: Self-pay | Admitting: Internal Medicine

## 2021-03-07 ENCOUNTER — Other Ambulatory Visit: Payer: Self-pay

## 2021-03-07 ENCOUNTER — Ambulatory Visit (AMBULATORY_SURGERY_CENTER): Payer: 59 | Admitting: Internal Medicine

## 2021-03-07 VITALS — BP 128/74 | HR 75 | Temp 98.0°F | Resp 13 | Ht 70.0 in | Wt 190.0 lb

## 2021-03-07 DIAGNOSIS — D132 Benign neoplasm of duodenum: Secondary | ICD-10-CM | POA: Diagnosis not present

## 2021-03-07 DIAGNOSIS — K219 Gastro-esophageal reflux disease without esophagitis: Secondary | ICD-10-CM | POA: Diagnosis not present

## 2021-03-07 DIAGNOSIS — R1310 Dysphagia, unspecified: Secondary | ICD-10-CM

## 2021-03-07 DIAGNOSIS — K222 Esophageal obstruction: Secondary | ICD-10-CM

## 2021-03-07 MED ORDER — PANTOPRAZOLE SODIUM 40 MG PO TBEC: 40.0000 mg | DELAYED_RELEASE_TABLET | Freq: Every day | ORAL | 11 refills | Status: DC

## 2021-03-07 MED ORDER — SODIUM CHLORIDE 0.9 % IV SOLN: 500.0000 mL | Freq: Once | INTRAVENOUS | Status: DC

## 2021-03-07 NOTE — Progress Notes (Signed)
Called to room to assist during endoscopic procedure.  Patient ID and intended procedure confirmed with present staff. Received instructions for my participation in the procedure from the performing physician.  

## 2021-03-07 NOTE — Progress Notes (Signed)
Pt's states no medical or surgical changes since previsit or office visit.   VS taken by DT 

## 2021-03-07 NOTE — Progress Notes (Signed)
Sedate, gd SR, tolerated procedure well, VSS, report to RN 

## 2021-03-07 NOTE — Progress Notes (Signed)
GI PREPROCEDURE NOTE  Patient was seen February 02, 2021 for colonoscopy.  See that evaluation.  He complained of intermittent solid food dysphagia and was set up for upper endoscopy with possible esophageal dilation today.  No interval changes.

## 2021-03-07 NOTE — Patient Instructions (Signed)
Start Pantoprazole 40 mg daily. Follow-up biopsies Follow post dilation diet Office follow up with Dr. Henrene Pastor in 4-6 weeks.  YOU HAD AN ENDOSCOPIC PROCEDURE TODAY AT [facility]:   Refer to the procedure report that was given to you for any specific questions about what was found during the examination.  If the procedure report does not answer your questions, please call your gastroenterologist to clarify.  If you requested that your care partner not be given the details of your procedure findings, then the procedure report has been included in a sealed envelope for you to review at your convenience later.  YOU SHOULD EXPECT: Some feelings of bloating in the abdomen. Passage of more gas than usual.  Walking can help get rid of the air that was put into your GI tract during the procedure and reduce the bloating. If you had a lower endoscopy (such as a colonoscopy or flexible sigmoidoscopy) you may notice spotting of blood in your stool or on the toilet paper. If you underwent a bowel prep for your procedure, you may not have a normal bowel movement for a few days.  Please Note:  You might notice some irritation and congestion in your nose or some drainage.  This is from the oxygen used during your procedure.  There is no need for concern and it should clear up in a day or so.  SYMPTOMS TO REPORT IMMEDIATELY:   Following upper endoscopy (EGD)  Vomiting of blood or coffee ground material  New chest pain or pain under the shoulder blades  Painful or persistently difficult swallowing  New shortness of breath  Fever of 100F or higher  Black, tarry-looking stools  For urgent or emergent issues, a gastroenterologist can be reached at any hour by calling [phone]. Do not use MyChart messaging for urgent concerns.    DIET:  We do recommend a small meal at first, but then you may proceed to your regular diet.  Drink plenty of fluids but you should avoid alcoholic beverages for  24 hours.  ACTIVITY:  You should plan to take it easy for the rest of today and you should NOT DRIVE or use heavy machinery until tomorrow (because of the sedation medicines used during the test).    FOLLOW UP: Our staff will call the number listed on your records 48-72 hours following your procedure to check on you and address any questions or concerns that you may have regarding the information given to you following your procedure. If we do not reach you, we will leave a message.  We will attempt to reach you two times.  During this call, we will ask if you have developed any symptoms of COVID 19. If you develop any symptoms (ie: fever, flu-like symptoms, shortness of breath, cough etc.) before then, please call [phone].  If you test positive for Covid 19 in the 2 weeks post procedure, please call and report this information to [place].    If any biopsies were taken you will be contacted by phone or by letter within the next 1-3 weeks.  Please call us at [phone] if you have not heard about the biopsies in 3 weeks.    SIGNATURES/CONFIDENTIALITY: You and/or your care partner have signed paperwork which will be entered into your electronic medical record.  These signatures attest to the fact that that the information above on your After Visit Summary has been reviewed and is understood.  Full responsibility of the confidentiality of this discharge  information lies with you and/or your care-partner.

## 2021-03-07 NOTE — Op Note (Signed)
[facility] Patient Name: Brandon Woodward Procedure Date: 03/07/2021 9:52 AM MRN: [id number] Endoscopist: Docia Chuck. Henrene Pastor , MD Age: 65 Referring MD:  Date of Birth: Mar 18, 1956 Gender: Male Account #: [id number] Procedure:                Upper GI endoscopy with biopsies; submucosal                            injection; Maloney dilation 52F Indications:              Dysphagia Medicines:                Monitored Anesthesia Care Procedure:                Pre-Anesthesia Assessment:                           - Prior to the procedure, a History and Physical                            was performed, and patient medications and                            allergies were reviewed. The patient's tolerance of                            previous anesthesia was also reviewed. The risks                            and benefits of the procedure and the sedation                            options and risks were discussed with the patient.                            All questions were answered, and informed consent                            was obtained. Prior Anticoagulants: The patient has                            taken no previous anticoagulant or antiplatelet                            agents. ASA Grade Assessment: II - A patient with                            mild systemic disease. After reviewing the risks                            and benefits, the patient was deemed in                            satisfactory condition to undergo the procedure.  After obtaining informed consent, the endoscope was                            passed under direct vision. Throughout the                            procedure, the patient's blood pressure, pulse, and                            oxygen saturations were monitored continuously. The                            GIF [id number] was introduced through the                            mouth, and advanced to the second part of  duodenum.                            The upper GI endoscopy was accomplished without                            difficulty. The patient tolerated the procedure                            well. Scope In: Scope Out: Findings:                 The exam of the esophagus revealed large caliber                            ringing throughout.                           One benign-appearing, intrinsic moderate stenosis                            was found 38 cm from the incisors. There was                            associated edema. This stenosis measured 1.4 cm                            (inner diameter). She completed the endoscopic                            survey, the scope was withdrawn. Dilation was                            performed with a Maloney dilator with no resistance                            at 68 Fr.                           The stomach was normal.  The examined duodenum revealed a 9 mm linear                            polypoid lesion in the second portion. Multiple                            biopsies were taken, for removal, with a cold                            forceps for histology. The area was tattooed for                            future reference with carbon black.                           The cardia and gastric fundus were normal on                            retroflexion. Complications:            No immediate complications. Estimated Blood Loss:     Estimated blood loss: none. Impression:               1. GERD with peptic stricture status post                            dilation?"2 French                           2. Small duodenal polypoid lesion. Biopsy removed.                            Marked with tattoo ink                           3. Otherwise normal exam. Recommendation:           1. Prescribe pantoprazole 40 mg daily; #30; 11                            refills                           2. Follow-up biopsies                            3. Post dilation diet                           4. Office follow-up with Dr. Henrene Pastor in 4 to 6 weeks. Docia Chuck. Henrene Pastor, MD 03/07/2021 10:38:57 AM This report has been signed electronically.

## 2021-03-09 ENCOUNTER — Telehealth: Payer: Self-pay

## 2021-03-09 NOTE — Telephone Encounter (Signed)
  Follow up Call-  Call back number 03/07/2021 02/02/2021  Post procedure Call Back phone  # [phone]  Permission to leave phone message Yes Yes  Some recent data might be hidden     Patient questions:  Do you have a fever, pain , or abdominal swelling? No. Pain Score  0 *  Have you tolerated food without any problems? Yes.    Have you been able to return to your normal activities? Yes.    Do you have any questions about your discharge instructions: Diet   No. Medications  No. Follow up visit  No.  Do you have questions or concerns about your Care? No.  Actions: * If pain score is 4 or above: No action needed, pain <4.

## 2021-03-11 ENCOUNTER — Telehealth: Payer: Self-pay | Admitting: Emergency Medicine

## 2021-03-11 NOTE — Telephone Encounter (Signed)
Need to get this patient in as a new consult for subacute dyspnea and cough. Optimally w a CXR prior.   OK to put him in my blocked slots this week.

## 2021-03-13 ENCOUNTER — Encounter: Payer: Self-pay | Admitting: Internal Medicine

## 2021-03-15 ENCOUNTER — Other Ambulatory Visit: Payer: Self-pay

## 2021-03-15 ENCOUNTER — Ambulatory Visit: Payer: 59 | Admitting: Emergency Medicine

## 2021-03-15 ENCOUNTER — Ambulatory Visit (INDEPENDENT_AMBULATORY_CARE_PROVIDER_SITE_OTHER): Payer: 59

## 2021-03-15 ENCOUNTER — Encounter: Payer: Self-pay | Admitting: Emergency Medicine

## 2021-03-15 VITALS — BP 128/80 | HR 76 | Temp 97.7°F | Ht 70.0 in | Wt 191.2 lb

## 2021-03-15 DIAGNOSIS — R059 Cough, unspecified: Secondary | ICD-10-CM | POA: Diagnosis not present

## 2021-03-15 DIAGNOSIS — R053 Chronic cough: Secondary | ICD-10-CM | POA: Insufficient documentation

## 2021-03-15 MED ORDER — PREDNISONE 10 MG PO TABS: 30.0000 mg | ORAL_TABLET | Freq: Every day | ORAL | 0 refills | Status: AC

## 2021-03-15 NOTE — Assessment & Plan Note (Signed)
Brandon Woodward's symptoms have been persistent, really have only been fully controlled when he was on prednisone.  Sounds largely upper airway in nature although he has had some response to albuterol when he feels that his chest is closing up, breathing is acutely difficult.  He has moderately well-controlled GERD but does still have some breakthrough, is now on pantoprazole (started recently).  Sounds like he also has some nasal sinus disease and a mucus burden that may be contributing.  He is not on therapy for this right now.  He has had nasal polyps removed in the past.  I think we should try treating presumed underlying contributors more aggressively.  I will give him prednisone for 5 days to try and eliminate any existing inflammation, hopefully treating rhinitis and GERD more aggressively will allow [place] to maintain stability. Plan for pulmonary function testing to rule out component of underlying obstructive lung disease as a contributor.   We we will perform full pulmonary function testing in next office visit. We will perform a chest x-ray today Please take prednisone 30 mg daily for the next 5 days. Temporarily increase your pantoprazole to 40 mg twice a day.  Start now and continue this until 1 week after your prednisone treatment.  Then go back to once a day. Start your fluticasone nasal spray, 2 sprays each nostril twice daily.  Do not take this medication right before bedtime.  Take it about an hour before bedtime instead. Start loratadine 10 mg (generic Claritin) once daily until next visit. Do your best to avoid throat clearing.  Sometimes using an mentholated cough drops or sugar-free candy can help.  When you have the urge to clear your throat, just swallow. Follow with Brandon Woodward next available with full pulmonary function testing on the same day.

## 2021-03-15 NOTE — Progress Notes (Signed)
Subjective:    Patient ID: Brandon Woodward, male    DOB: 10-Nov-1955, 65 y.o.   MRN: [id number]  HPI Pleasant 65 year old gentleman, former smoker (5-7 pack years) with little past medical history besides hypertension, nephrolithiasis, colonic polyps, GERD. Also a hx of nasal polyps, removed 15 yrs ago. He had treated TB as an infant.  He is referred today for cough and dyspnea. He reports that he has a longstanding minimal cough, but that cough really became active and persistent in Summer 2022.  Was treated for possible bronchitis, then also w prednisone x 2. Temporary relief of cough and UA wheeze, but then began again. He hears the noise when he lays in bed, has to clear mucous in the am - green to clear. A lot of throat clearing. He was given some albuterol - seems to open his chest, allows him clear some mucous. Unfortunately he also had COVID in the interim. He uses nasal steroid occasionally, in allergy season.  He is active, walks his dogs, able to exert.   Some hx of dysphagia, underwent esophageal dilation last month (Dr Henrene Pastor).    Review of Systems As per HPI  Past Medical History:  Diagnosis Date   Colon polyps 04/11/2008   Ileocecal valve and hyperplastic (2)   Nephrolithiasis      Family History  Problem Relation Age of Onset   Cervical cancer Mother    Colon cancer Father 76   Prostate cancer Father    Esophageal cancer Neg Hx    Stomach cancer Neg Hx    Rectal cancer Neg Hx      Social History   Socioeconomic History   Marital status: Married    Spouse name: Not on file   Number of children: 2   Years of education: Not on file   Highest education level: Not on file  Occupational History   Occupation: self employed    Employer: [employer]   Tobacco Use   Smoking status: Former    Types: Cigarettes   Smokeless tobacco: Never  Vaping Use   Vaping Use: Never used  Substance and Sexual Activity   Alcohol use: Yes    Alcohol/week: 4.0 standard  drinks    Types: 4 Cans of beer per week   Drug use: No   Sexual activity: Not on file  Other Topics Concern   Not on file  Social History Narrative   Not on file   Social Determinants of Health   Financial Resource Strain: Not on file  Food Insecurity: Not on file  Transportation Needs: Not on file  Physical Activity: Not on file  Stress: Not on file  Social Connections: Not on file  Intimate Partner Violence: Not on file     No Known Allergies   Outpatient Medications Prior to Visit  Medication Sig Dispense Refill   amLODipine (NORVASC) 2.5 MG tablet Take 2.5 mg by mouth daily.     Esomeprazole Magnesium (NEXIUM PO) Take by mouth daily as needed.     fluticasone (FLONASE) 50 MCG/ACT nasal spray fluticasone propionate 50 mcg/actuation nasal spray,suspension  USE 2 SPRAYS IN EACH NOSTRIL DAILY     ibuprofen (ADVIL,MOTRIN) 200 MG tablet Take 200 mg by mouth every 6 (six) hours as needed.     meloxicam (MOBIC) 15 MG tablet meloxicam 15 mg tablet     pantoprazole (PROTONIX) 40 MG tablet Take 1 tablet (40 mg total) by mouth daily. 30 tablet 11   rosuvastatin (CRESTOR) 5 MG  tablet Take 5 mg by mouth daily.     No facility-administered medications prior to visit.         Objective:   Physical Exam Vitals:   03/15/21 1007  BP: 128/80  Pulse: 76  Temp: 97.7 F (36.5 C)  TempSrc: Oral  SpO2: 100%  Weight: 191 lb 3.2 oz (86.7 kg)  Height: 5\' 10"  (1.778 m)    Gen: Pleasant, well-nourished, in no distress,  normal affect  ENT: No lesions,  mouth clear,  oropharynx clear, no postnasal drip  Neck: No JVD, no stridor  Lungs: No use of accessory muscles, no crackles or wheezing on normal respiration, no wheeze on forced expiration  Cardiovascular: RRR, heart sounds normal, no murmur or gallops, no peripheral edema  Musculoskeletal: No deformities, no cyanosis or clubbing  Neuro: alert, awake, non focal  Skin: Warm, no lesions or rash      Assessment & Plan:    Chronic cough Brandon Woodward's symptoms have been persistent, really have only been fully controlled when he was on prednisone.  Sounds largely upper airway in nature although he has had some response to albuterol when he feels that his chest is closing up, breathing is acutely difficult.  He has moderately well-controlled GERD but does still have some breakthrough, is now on pantoprazole (started recently).  Sounds like he also has some nasal sinus disease and a mucus burden that may be contributing.  He is not on therapy for this right now.  He has had nasal polyps removed in the past.  I think we should try treating presumed underlying contributors more aggressively.  I will give him prednisone for 5 days to try and eliminate any existing inflammation, hopefully treating rhinitis and GERD more aggressively will allow [place] to maintain stability. Plan for pulmonary function testing to rule out component of underlying obstructive lung disease as a contributor.   We we will perform full pulmonary function testing in next office visit. We will perform a chest x-ray today Please take prednisone 30 mg daily for the next 5 days. Temporarily increase your pantoprazole to 40 mg twice a day.  Start now and continue this until 1 week after your prednisone treatment.  Then go back to once a day. Start your fluticasone nasal spray, 2 sprays each nostril twice daily.  Do not take this medication right before bedtime.  Take it about an hour before bedtime instead. Start loratadine 10 mg (generic Claritin) once daily until next visit. Do your best to avoid throat clearing.  Sometimes using an mentholated cough drops or sugar-free candy can help.  When you have the urge to clear your throat, just swallow. Follow with Dr. Lamonte Sakai next available with full pulmonary function testing on the same day.   Time spent 60 minutes  Baltazar Apo, MD, PhD 03/15/2021, 5:17 PM [facility] [phone] or if no  answer before 7:00PM call [phone] For any issues after 7:00PM please call eLink [phone]

## 2021-03-15 NOTE — Patient Instructions (Addendum)
We we will perform full pulmonary function testing in next office visit. We will perform a chest x-ray today Please take prednisone 30 mg daily for the next 5 days. Temporarily increase your pantoprazole to 40 mg twice a day.  Start now and continue this until 1 week after your prednisone treatment.  Then go back to once a day. Start your fluticasone nasal spray, 2 sprays each nostril twice daily.  Do not take this medication right before bedtime.  Take it about an hour before bedtime instead. Start loratadine 10 mg (generic Claritin) once daily until next visit. Do your best to avoid throat clearing.  Sometimes using an mentholated cough drops or sugar-free candy can help.  When you have the urge to clear your throat, just swallow. Follow with Dr. Lamonte Sakai next available with full pulmonary function testing on the same day.

## 2021-03-19 NOTE — Telephone Encounter (Signed)
Pt seen on 03/15/2021. Nothing further needed at this time.

## 2021-03-22 ENCOUNTER — Ambulatory Visit: Payer: 59 | Admitting: Emergency Medicine

## 2021-04-18 ENCOUNTER — Ambulatory Visit: Payer: 59 | Admitting: Internal Medicine

## 2021-04-18 ENCOUNTER — Encounter: Payer: Self-pay | Admitting: Internal Medicine

## 2021-04-18 VITALS — BP 158/90 | HR 71 | Ht 70.0 in | Wt 192.0 lb

## 2021-04-18 DIAGNOSIS — R1310 Dysphagia, unspecified: Secondary | ICD-10-CM

## 2021-04-18 DIAGNOSIS — K219 Gastro-esophageal reflux disease without esophagitis: Secondary | ICD-10-CM | POA: Diagnosis not present

## 2021-04-18 DIAGNOSIS — K222 Esophageal obstruction: Secondary | ICD-10-CM

## 2021-04-18 DIAGNOSIS — Z86010 Personal history of colonic polyps: Secondary | ICD-10-CM

## 2021-04-18 DIAGNOSIS — D132 Benign neoplasm of duodenum: Secondary | ICD-10-CM | POA: Diagnosis not present

## 2021-04-18 NOTE — Progress Notes (Signed)
HISTORY OF PRESENT ILLNESS:  Brandon Woodward is a 65 y.o. male who presents today for follow-up after recent endoscopic procedures.  The patient has a family history of colon cancer in his father less than age 47.  On February 02, 2021 he underwent routine follow-up screening.  Prior examinations were negative for neoplasia.  This most recent examination revealed multiple colon polyps which were tubular adenomas (3) and a sessile serrated polyp.  Follow-up in 5 years recommended.  At that time patient reported problems with reflux and progressive dysphagia.  Thus, he was set up for upper endoscopy which was performed March 07, 2021.  He was found to have a benign peptic stricture which was dilated with 54 [place] Mal[place] dilator.  In addition, an incidental small duodenal polypoid lesion which was biopsied/removed and returned as a tubular adenoma.  The area was marked with tattoo.  Follow-up EGD in 6 months recommended.  He presents now for routine office follow-up as recommended.  At the time of his endoscopy he was prescribed pantoprazole 40 mg daily.  He tells me that he has had no indigestion or heartburn since initiating this medication.  He is pleased.  As well, no recurrent dysphagia.  Again, he is pleased.  No appreciable medication side effects.  No new problems.  REVIEW OF SYSTEMS:  All non-GI ROS negative unless otherwise stated in the HPI.  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 His smoking use included cigarettes. He has never used smokeless tobacco. He reports current alcohol use of about 4.0 standard drinks per week. He reports that he does not use drugs.  family history includes  Cervical cancer in his mother; Colon cancer (age of onset: 63) in his father; Prostate cancer in his father.  No Known Allergies     PHYSICAL EXAMINATION:  Vital signs: BP (!) 158/90   Pulse 71   Ht 5\' 10"  (1.778 m)   Wt 192 lb (87.1 kg)   SpO2 95%   BMI 27.55 kg/m  General: Well-developed, well-nourished, no acute distress HEENT: Sclerae are anicteric, conjunctiva pink. Oral mucosa intact Lungs: Clear Heart: Regular Abdomen: soft, nontender, nondistended, no obvious ascites, no peritoneal signs, normal bowel sounds. No organomegaly. Extremities: No edema Psychiatric: alert and oriented x3. Cooperative     ASSESSMENT:  1.  GERD complicated by peptic stricture status post esophageal dilation.  Currently asymptomatic post dilation on PPI. 2.  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  Tattooed. 3.  Multiple adenomatous and sessile serrated polyps. 4.  Family history of colon cancer   PLAN:  1.  Reflux precautions 2.  Continue pantoprazole 40 mg daily.  Medication risks reviewed 3.  Planning relook upper endoscopy in 6 months to reassess the area of resected duodenal adenoma. 4.  Surveillance colonoscopy 5 years 5.  At least annual office follow-up for management of chronic GERD. A total time of 30 minutes was spent preparing to see the patient, reviewing test, endoscopy reports, and pathology.  Obtaining interval history.  Performing medically appropriate examination, counseling the patient regarding the above listed issues, ordering medications and follow-up endoscopy, and documenting clinical information in the health  record

## 2021-04-18 NOTE — Patient Instructions (Signed)
If you are age 67 or older, your body mass index should be between 23-30. Your Body mass index is 27.55 kg/m. If this is out of the aforementioned range listed, please consider follow up with your Primary Care Provider.  If you are age 74 or younger, your body mass index should be between 19-25. Your Body mass index is 27.55 kg/m. If this is out of the aformentioned range listed, please consider follow up with your Primary Care Provider.   ________________________________________________________  The [facility] providers would like to encourage you to use [facility] to communicate with providers for non-urgent requests or questions.  Due to long hold times on the telephone, sending your provider a message by [facility] may be a faster and more efficient way to get a response.  Please allow 48 business hours for a response.  Please remember that this is for non-urgent requests.  _______________________________________________________  Brandon Woodward are due for an EGD in 6 months

## 2021-05-08 ENCOUNTER — Other Ambulatory Visit: Payer: Self-pay | Admitting: Emergency Medicine

## 2021-05-08 DIAGNOSIS — R053 Chronic cough: Secondary | ICD-10-CM

## 2021-05-09 ENCOUNTER — Other Ambulatory Visit: Payer: Self-pay

## 2021-05-09 ENCOUNTER — Encounter: Payer: Self-pay | Admitting: Emergency Medicine

## 2021-05-09 ENCOUNTER — Ambulatory Visit: Payer: 59 | Admitting: Emergency Medicine

## 2021-05-09 ENCOUNTER — Ambulatory Visit (INDEPENDENT_AMBULATORY_CARE_PROVIDER_SITE_OTHER): Payer: 59

## 2021-05-09 ENCOUNTER — Ambulatory Visit (INDEPENDENT_AMBULATORY_CARE_PROVIDER_SITE_OTHER): Payer: 59 | Admitting: Emergency Medicine

## 2021-05-09 VITALS — BP 130/70 | HR 73 | Temp 97.8°F | Ht 69.0 in | Wt 194.4 lb

## 2021-05-09 DIAGNOSIS — R053 Chronic cough: Secondary | ICD-10-CM

## 2021-05-09 DIAGNOSIS — R918 Other nonspecific abnormal finding of lung field: Secondary | ICD-10-CM | POA: Insufficient documentation

## 2021-05-09 DIAGNOSIS — K219 Gastro-esophageal reflux disease without esophagitis: Secondary | ICD-10-CM

## 2021-05-09 DIAGNOSIS — R0609 Other forms of dyspnea: Secondary | ICD-10-CM

## 2021-05-09 DIAGNOSIS — R0600 Dyspnea, unspecified: Secondary | ICD-10-CM | POA: Insufficient documentation

## 2021-05-09 LAB — PULMONARY FUNCTION TEST
DL/VA % pred: 119 %
DL/VA: 4.9 ml/min/mmHg/L
DLCO cor % pred: 118 %
DLCO cor: 30.44 ml/min/mmHg
DLCO unc % pred: 118 %
DLCO unc: 30.44 ml/min/mmHg
FEF 25-75 Post: 1.55 L/sec
FEF 25-75 Pre: 1.56 L/sec
FEF2575-%Change-Post: 0 %
FEF2575-%Pred-Post: 61 %
FEF2575-%Pred-Pre: 61 %
FEV1-%Change-Post: -1 %
FEV1-%Pred-Post: 83 %
FEV1-%Pred-Pre: 84 %
FEV1-Post: 2.7 L
FEV1-Pre: 2.75 L
FEV1FVC-%Change-Post: 0 %
FEV1FVC-%Pred-Pre: 87 %
FEV6-%Change-Post: -2 %
FEV6-%Pred-Post: 98 %
FEV6-%Pred-Pre: 100 %
FEV6-Post: 4.05 L
FEV6-Pre: 4.14 L
FEV6FVC-%Change-Post: 0 %
FEV6FVC-%Pred-Post: 104 %
FEV6FVC-%Pred-Pre: 103 %
FVC-%Change-Post: -2 %
FVC-%Pred-Post: 94 %
FVC-%Pred-Pre: 96 %
FVC-Post: 4.12 L
FVC-Pre: 4.23 L
Post FEV1/FVC ratio: 66 %
Post FEV6/FVC ratio: 99 %
Pre FEV1/FVC ratio: 65 %
Pre FEV6/FVC Ratio: 98 %
RV % pred: 167 %
RV: 3.91 L
TLC % pred: 121 %
TLC: 8.29 L

## 2021-05-09 MED ORDER — BUDESONIDE-FORMOTEROL FUMARATE 160-4.5 MCG/ACT IN AERO: 2.0000 | INHALATION_SPRAY | Freq: Two times a day (BID) | RESPIRATORY_TRACT | 6 refills | Status: DC

## 2021-05-09 NOTE — Progress Notes (Signed)
Subjective:    Patient ID: Brandon Woodward, male    DOB: March 30, 1956, 65 y.o.   MRN: [id number]  HPI Pleasant 65 year old gentleman, former smoker (5-7 pack years) with little past medical history besides hypertension, nephrolithiasis, colonic polyps, GERD. Also a hx of nasal polyps, removed 15 yrs ago. He had treated TB as an infant.  He is referred today for cough and dyspnea. He reports that he has a longstanding minimal cough, but that cough really became active and persistent in Summer 2022.  Was treated for possible bronchitis, then also w prednisone x 2. Temporary relief of cough and UA wheeze, but then began again. He hears the noise when he lays in bed, has to clear mucous in the am - green to clear. A lot of throat clearing. He was given some albuterol - seems to open his chest, allows him clear some mucous. Unfortunately he also had COVID in the interim. He uses nasal steroid occasionally, in allergy season.  He is active, walks his dogs, able to exert.   Some hx of dysphagia, underwent esophageal dilation last month (Dr Henrene Pastor).    ROV 05/09/21 --Brandon Woodward follows up today for evaluation of chronic cough and dyspnea.  He is 42, minimal tobacco history with hypertension and GERD, nasal polyposis.  He also had treated TB as an infant.  Cough is been more bothersome over the last 6 to 8 months, required treatment with prednisone and for bronchitis.  He also had COVID in the interim which probably activated his cough and upper airway irritation. We tried increasing pantoprazole to twice daily temporarily, started fluticasone nasal spray and loratadine.  I also gave him 5 days of prednisone.   He reports that his cough has improved some, a bit less, still happens intermittently esp in the am. Some tan sputum. He is using albuterol daily, gets relief from it. Hears some wheeze at time.  Believes that the loratadine and nasal steroid have been helpful, still on pantoprazole qd  A chest x-ray done  03/15/2021 showed a subtle right basilar opacity of unclear significance.  PFT today reviewed by me show mild obstruction without a bronchodilator response, hyperinflated lung volumes, normal diffusion capacity.   Review of Systems As per HPI  Past Medical History:  Diagnosis Date   Colon polyps 04/11/2008   Ileocecal valve and hyperplastic (2)   Nephrolithiasis      Family History  Problem Relation Age of Onset   Cervical cancer Mother    Colon cancer Father 67   Prostate cancer Father    Esophageal cancer Neg Hx    Stomach cancer Neg Hx    Rectal cancer Neg Hx      Social History   Socioeconomic History   Marital status: Married    Spouse name: Not on file   Number of children: 2   Years of education: Not on file   Highest education level: Not on file  Occupational History   Occupation: self employed    Employer: [employer]   Tobacco Use   Smoking status: Former    Packs/day: 1.50    Years: 10.00    Pack years: 15.00    Types: Cigarettes    Quit date: 1993    Years since quitting: 30.0   Smokeless tobacco: Never  Vaping Use   Vaping Use: Never used  Substance and Sexual Activity   Alcohol use: Yes    Alcohol/week: 4.0 standard drinks    Types: 4 Cans of beer  per week   Drug use: No   Sexual activity: Not on file  Other Topics Concern   Not on file  Social History Narrative   Not on file   Social Determinants of Health   Financial Resource Strain: Not on file  Food Insecurity: Not on file  Transportation Needs: Not on file  Physical Activity: Not on file  Stress: Not on file  Social Connections: Not on file  Intimate Partner Violence: Not on file     No Known Allergies   Outpatient Medications Prior to Visit  Medication Sig Dispense Refill   albuterol (VENTOLIN HFA) 108 (90 Base) MCG/ACT inhaler Inhale 2 puffs into the lungs every 4 (four) hours as needed.     amLODipine (NORVASC) 2.5 MG tablet Take 2.5 mg by mouth daily.      fluticasone (FLONASE) 50 MCG/ACT nasal spray fluticasone propionate 50 mcg/actuation nasal spray,suspension  USE 2 SPRAYS IN EACH NOSTRIL DAILY     ibuprofen (ADVIL,MOTRIN) 200 MG tablet Take 200 mg by mouth every 6 (six) hours as needed.     meloxicam (MOBIC) 15 MG tablet meloxicam 15 mg tablet     pantoprazole (PROTONIX) 40 MG tablet Take 1 tablet (40 mg total) by mouth daily. 30 tablet 11   rosuvastatin (CRESTOR) 5 MG tablet Take 5 mg by mouth daily.     No facility-administered medications prior to visit.         Objective:   Physical Exam Vitals:   05/09/21 1219  BP: 130/70  Pulse: 73  Temp: 97.8 F (36.6 C)  TempSrc: Oral  SpO2: 96%  Weight: 194 lb 6.4 oz (88.2 kg)  Height: 5\' 9"  (1.753 m)    Gen: Pleasant, well-nourished, in no distress,  normal affect  ENT: No lesions,  mouth clear,  oropharynx clear, no postnasal drip  Neck: No JVD, no stridor  Lungs: No use of accessory muscles, no crackles or wheezing on normal respiration, no wheeze on forced expiration  Cardiovascular: RRR, heart sounds normal, no murmur or gallops, no peripheral edema  Musculoskeletal: No deformities, no cyanosis or clubbing  Neuro: alert, awake, non focal  Skin: Warm, no lesions or rash      Assessment & Plan:   Dyspnea Pulmonary function testing consistent with mild obstruction and hyperinflation, suspect that he has fixed asthma.  This would be consistent with his dyspnea, his cough, his response to albuterol and prednisone.  He is using albuterol frequently and I think he would benefit from controller medication.  I will start a LABA/ICS for now, see how he responds.  Could consider stepping back to ICS alone depending on how he does.  Keep albuterol available to use if needed.  Chronic cough Plan to continue the pantoprazole once daily, continue fluticasone nasal spray and loratadine. Evaluation consistent with asthma.  Plan to treat as above, should help his cough as  well.  Pulmonary infiltrate Right midlung hazy opacity noted on chest x-ray from 03/15/2021.  Less prominent on chest x-ray from today 12/28.  Appears to be vascular structures.  Will hold off on CT scan of the chest for now.  Time spent 66 minutes  Baltazar Apo, MD, PhD 05/09/2021, 6:00 PM [facility] [phone] or if no answer before 7:00PM call [phone] For any issues after 7:00PM please call eLink [phone]

## 2021-05-09 NOTE — Patient Instructions (Signed)
CXR today. We will start Symbicort 160/4.5 mcg, 2 puffs twice a day.  Remember to rinse and gargle after use this medication. Keep your albuterol available use 2 puffs when needed for shortness of breath, chest tightness, wheezing. Continue your fluticasone nasal spray Continue your loratadine Continue your pantoprazole. Follow with Dr Lamonte Sakai in 3 months or sooner if you have any problems.

## 2021-05-09 NOTE — Assessment & Plan Note (Signed)
Right midlung hazy opacity noted on chest x-ray from 03/15/2021.  Less prominent on chest x-ray from today 12/28.  Appears to be vascular structures.  Will hold off on CT scan of the chest for now.

## 2021-05-09 NOTE — Progress Notes (Signed)
PFT done today. 

## 2021-05-09 NOTE — Assessment & Plan Note (Signed)
Plan to continue the pantoprazole once daily, continue fluticasone nasal spray and loratadine. Evaluation consistent with asthma.  Plan to treat as above, should help his cough as well.

## 2021-05-09 NOTE — Assessment & Plan Note (Signed)
Pulmonary function testing consistent with mild obstruction and hyperinflation, suspect that he has fixed asthma.  This would be consistent with his dyspnea, his cough, his response to albuterol and prednisone.  He is using albuterol frequently and I think he would benefit from controller medication.  I will start a LABA/ICS for now, see how he responds.  Could consider stepping back to ICS alone depending on how he does.  Keep albuterol available to use if needed.

## 2021-07-19 ENCOUNTER — Other Ambulatory Visit: Payer: Self-pay | Admitting: Internal Medicine

## 2021-07-19 DIAGNOSIS — K219 Gastro-esophageal reflux disease without esophagitis: Secondary | ICD-10-CM

## 2021-08-07 ENCOUNTER — Other Ambulatory Visit: Payer: Self-pay

## 2021-08-07 ENCOUNTER — Ambulatory Visit: Payer: 59 | Admitting: Emergency Medicine

## 2021-08-07 ENCOUNTER — Encounter: Payer: Self-pay | Admitting: Emergency Medicine

## 2021-08-07 DIAGNOSIS — J310 Chronic rhinitis: Secondary | ICD-10-CM | POA: Diagnosis not present

## 2021-08-07 DIAGNOSIS — J4520 Mild intermittent asthma, uncomplicated: Secondary | ICD-10-CM | POA: Diagnosis not present

## 2021-08-07 DIAGNOSIS — K219 Gastro-esophageal reflux disease without esophagitis: Secondary | ICD-10-CM

## 2021-08-07 MED ORDER — FLUTICASONE PROPIONATE HFA 110 MCG/ACT IN AERO: 2.0000 | INHALATION_SPRAY | Freq: Two times a day (BID) | RESPIRATORY_TRACT | 12 refills | Status: DC

## 2021-08-07 NOTE — Patient Instructions (Signed)
We will temporarily stop Symbicort. ?Try starting Flovent 110 mcg, 2 puffs twice a day.  Rinse and gargle after using. ?Keep your albuterol available to use 2 puffs when you needed for shortness of breath, chest tightness, wheezing. ?Please call our office if you notice increased symptoms, increased albuterol use as we make this medication change.  If so then we will probably go back to the Symbicort. ?Continue your pantoprazole as you have been taking it ?Continue your loratadine as you have been taking it ?Continue your fluticasone nasal spray as you have been taking it. ?Keep your flu shot and COVID-19 vaccine up-to-date ?Follow with Dr. Lamonte Sakai in 12 months or sooner if you have any problems.  ? ?

## 2021-08-07 NOTE — Assessment & Plan Note (Signed)
Continue your loratadine as you have been taking it ?Continue your fluticasone nasal spray as you have been taking it. ?

## 2021-08-07 NOTE — Assessment & Plan Note (Signed)
On maintenance pantoprazole.  He has benefited from this.  Does seem to have probably impacted and improved his cough. ?

## 2021-08-07 NOTE — Progress Notes (Signed)
? ?Subjective:  ? ? Patient ID: Brandon Woodward, male    DOB: 1956-04-22, 66 y.o.   MRN: [id number] ? ?HPI ? ?ROV 05/09/21 --[name] follows up today for evaluation of chronic cough and dyspnea.  He is 15, minimal tobacco history with hypertension and GERD, nasal polyposis.  He also had treated TB as an infant.  Cough is been more bothersome over the last 6 to 8 months, required treatment with prednisone and for bronchitis.  He also had COVID in the interim which probably activated his cough and upper airway irritation. ?We tried increasing pantoprazole to twice daily temporarily, started fluticasone nasal spray and loratadine.  I also gave him 5 days of prednisone.   ?He reports that his cough has improved some, a bit less, still happens intermittently esp in the am. Some tan sputum. He is using albuterol daily, gets relief from it. Hears some wheeze at time.  ?Believes that the loratadine and nasal steroid have been helpful, still on pantoprazole qd ? ?A chest x-ray done 03/15/2021 showed a subtle right basilar opacity of unclear significance. ? ?PFT today reviewed by me show mild obstruction without a bronchodilator response, hyperinflated lung volumes, normal diffusion capacity. ? ?ROV 08/07/21 --follow-up visit for 66 year old gentleman with a history of chronic cough, allergic rhinitis with nasal polyposis, GERD.  Also with a remote history of TB as an infant.  He has mild obstruction on pulmonary function testing and we started Symbicort back in December.  Continued pantoprazole, loratadine, fluticasone nasal spray. ?He is feeling quite well - no cough. On PPI, loratadine, nasal steroid.  ?No flares.  ? ?Review of Systems ?As per HPI ? ?Past Medical History:  ?Diagnosis Date  ? Colon polyps 04/11/2008  ? Ileocecal valve and hyperplastic (2)  ? Nephrolithiasis   ?  ? ?Family History  ?Problem Relation Age of Onset  ? Cervical cancer Mother   ? Colon cancer Father 16  ? Prostate cancer Father   ? Esophageal cancer  Neg Hx   ? Stomach cancer Neg Hx   ? Rectal cancer Neg Hx   ?  ? ?Social History  ? ?Socioeconomic History  ? Marital status: Married  ?  Spouse name: Not on file  ? Number of children: 2  ? Years of education: Not on file  ? Highest education level: Not on file  ?Occupational History  ? Occupation: self employed  ?  Employer: [employer]   ?Tobacco Use  ? Smoking status: Former  ?  Packs/day: 1.50  ?  Years: 10.00  ?  Pack years: 15.00  ?  Types: Cigarettes  ?  Quit date: 73  ?  Years since quitting: 30.2  ? Smokeless tobacco: Never  ?Vaping Use  ? Vaping Use: Never used  ?Substance and Sexual Activity  ? Alcohol use: Yes  ?  Alcohol/week: 4.0 standard drinks  ?  Types: 4 Cans of beer per week  ? Drug use: No  ? Sexual activity: Not on file  ?Other Topics Concern  ? Not on file  ?Social History Narrative  ? Not on file  ? ?Social Determinants of Health  ? ?Financial Resource Strain: Not on file  ?Food Insecurity: Not on file  ?Transportation Needs: Not on file  ?Physical Activity: Not on file  ?Stress: Not on file  ?Social Connections: Not on file  ?Intimate Partner Violence: Not on file  ?  ? ?No Known Allergies  ? ?Outpatient Medications Prior to Visit  ?Medication Sig Dispense  Refill  ? albuterol (VENTOLIN HFA) 108 (90 Base) MCG/ACT inhaler Inhale 2 puffs into the lungs every 4 (four) hours as needed.    ? amLODipine (NORVASC) 2.5 MG tablet Take 2.5 mg by mouth daily.    ? budesonide-formoterol (SYMBICORT) 160-4.5 MCG/ACT inhaler Inhale 2 puffs into the lungs in the morning and at bedtime. 1 each 6  ? fluticasone (FLONASE) 50 MCG/ACT nasal spray fluticasone propionate 50 mcg/actuation nasal spray,suspension ? USE 2 SPRAYS IN EACH NOSTRIL DAILY    ? ibuprofen (ADVIL,MOTRIN) 200 MG tablet Take 200 mg by mouth every 6 (six) hours as needed.    ? meloxicam (MOBIC) 15 MG tablet meloxicam 15 mg tablet    ? pantoprazole (PROTONIX) 40 MG tablet TAKE 1 TABLET BY MOUTH EVERY DAY 90 tablet 3  ? rosuvastatin  (CRESTOR) 5 MG tablet Take 5 mg by mouth daily.    ? ?No facility-administered medications prior to visit.  ? ? ? ? ?   ?Objective:  ? Physical Exam ?Vitals:  ? 08/07/21 0920  ?BP: 120/78  ?Pulse: 74  ?Temp: 97.8 ?F (36.6 ?C)  ?TempSrc: Oral  ?SpO2: 98%  ?Weight: 198 lb 9.6 oz (90.1 kg)  ?Height: 5' 10.5" (1.791 m)  ? ? ?Gen: Pleasant, well-nourished, in no distress,  normal affect ? ?ENT: No lesions,  mouth clear,  oropharynx clear, no postnasal drip ? ?Neck: No JVD, no stridor ? ?Lungs: No use of accessory muscles, no crackles or wheezing on normal respiration, no wheeze on forced expiration ? ?Cardiovascular: RRR, heart sounds normal, no murmur or gallops, no peripheral edema ? ?Musculoskeletal: No deformities, no cyanosis or clubbing ? ?Neuro: alert, awake, non focal ? ?Skin: Warm, no lesions or rash ? ? ?   ?Assessment & Plan:  ? ?Mild intermittent asthma ?Quite well.  Minimal symptoms, minimal albuterol use.  No flares.  He is interested in possibly trying to step down his maintenance therapy and I agree.  We will try going to Flovent and see if he tolerates.  Continue to treat his GERD and chronic rhinitis. ? ?We will temporarily stop Symbicort. ?Try starting Flovent 110 mcg, 2 puffs twice a day.  Rinse and gargle after using. ?Keep your albuterol available to use 2 puffs when you needed for shortness of breath, chest tightness, wheezing. ?Please call our office if you notice increased symptoms, increased albuterol use as we make this medication change.  If so then we will probably go back to the Symbicort. ?Keep your flu shot and COVID-19 vaccine up-to-date ?Follow with Dr. Lamonte Sakai in 12 months or sooner if you have any problems.  ? ?GERD ?On maintenance pantoprazole.  He has benefited from this.  Does seem to have probably impacted and improved his cough. ? ?Chronic rhinitis ?Continue your loratadine as you have been taking it ?Continue your fluticasone nasal spray as you have been taking it. ? ? ?Baltazar Apo, MD, PhD ?08/07/2021, 9:45 AM ?[facility] ?[phone] or if no answer before 7:00PM call [phone] ?For any issues after 7:00PM please call eLink [phone] ? ?

## 2021-08-07 NOTE — Assessment & Plan Note (Signed)
Quite well.  Minimal symptoms, minimal albuterol use.  No flares.  He is interested in possibly trying to step down his maintenance therapy and I agree.  We will try going to Flovent and see if he tolerates.  Continue to treat his GERD and chronic rhinitis. ? ?We will temporarily stop Symbicort. ?Try starting Flovent 110 mcg, 2 puffs twice a day.  Rinse and gargle after using. ?Keep your albuterol available to use 2 puffs when you needed for shortness of breath, chest tightness, wheezing. ?Please call our office if you notice increased symptoms, increased albuterol use as we make this medication change.  If so then we will probably go back to the Symbicort. ?Keep your flu shot and COVID-19 vaccine up-to-date ?Follow with Dr. Lamonte Sakai in 12 months or sooner if you have any problems.  ?

## 2021-09-27 ENCOUNTER — Encounter: Payer: Self-pay | Admitting: Gastroenterology

## 2022-03-31 IMAGING — DX DG CHEST 2V
2 series · 2 of 2 positions shown · non-contrast
Comparison: March 15, 2021.

CLINICAL DATA: Chronic cough.

EXAM:
CHEST - 2 VIEW

[chest pa]
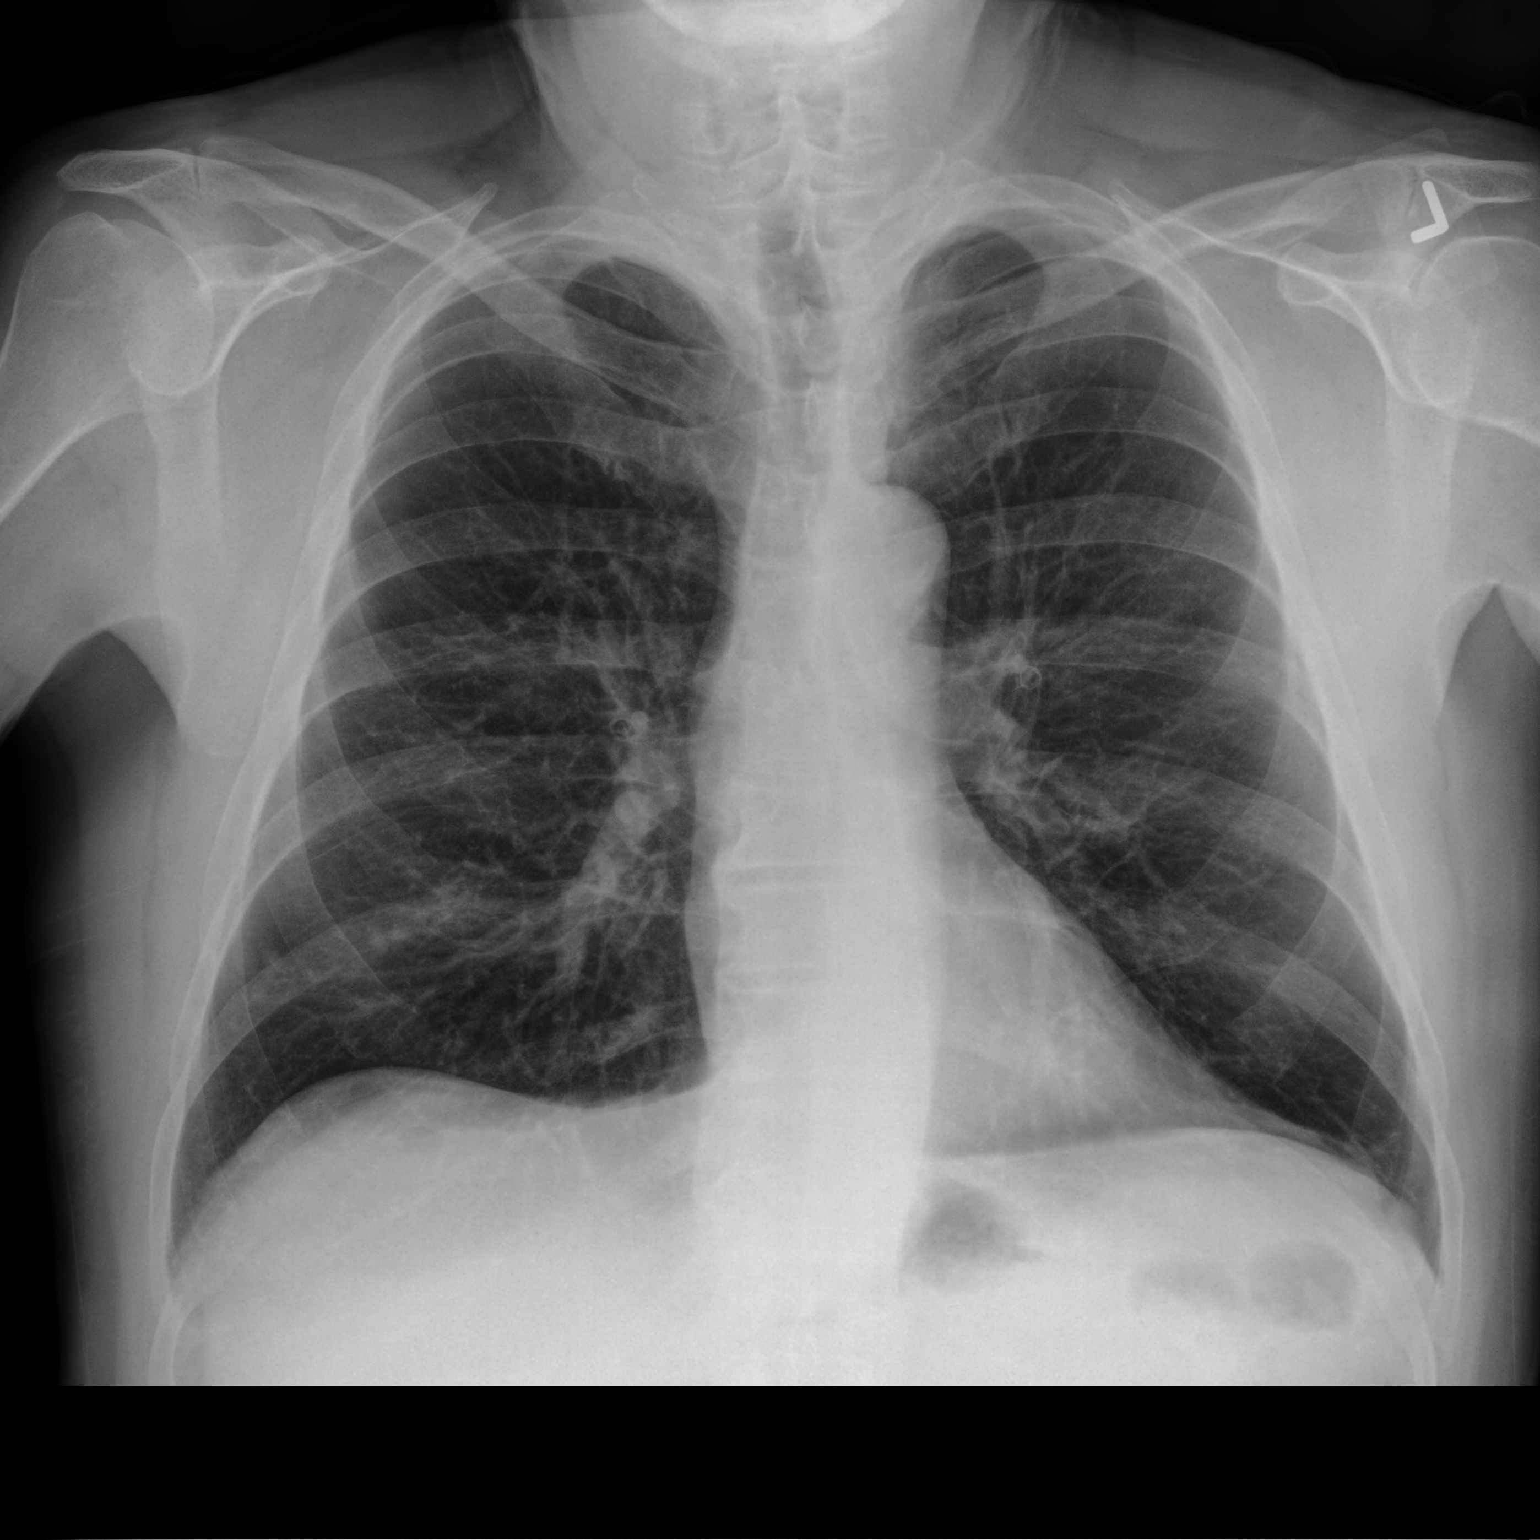

[chest lat]
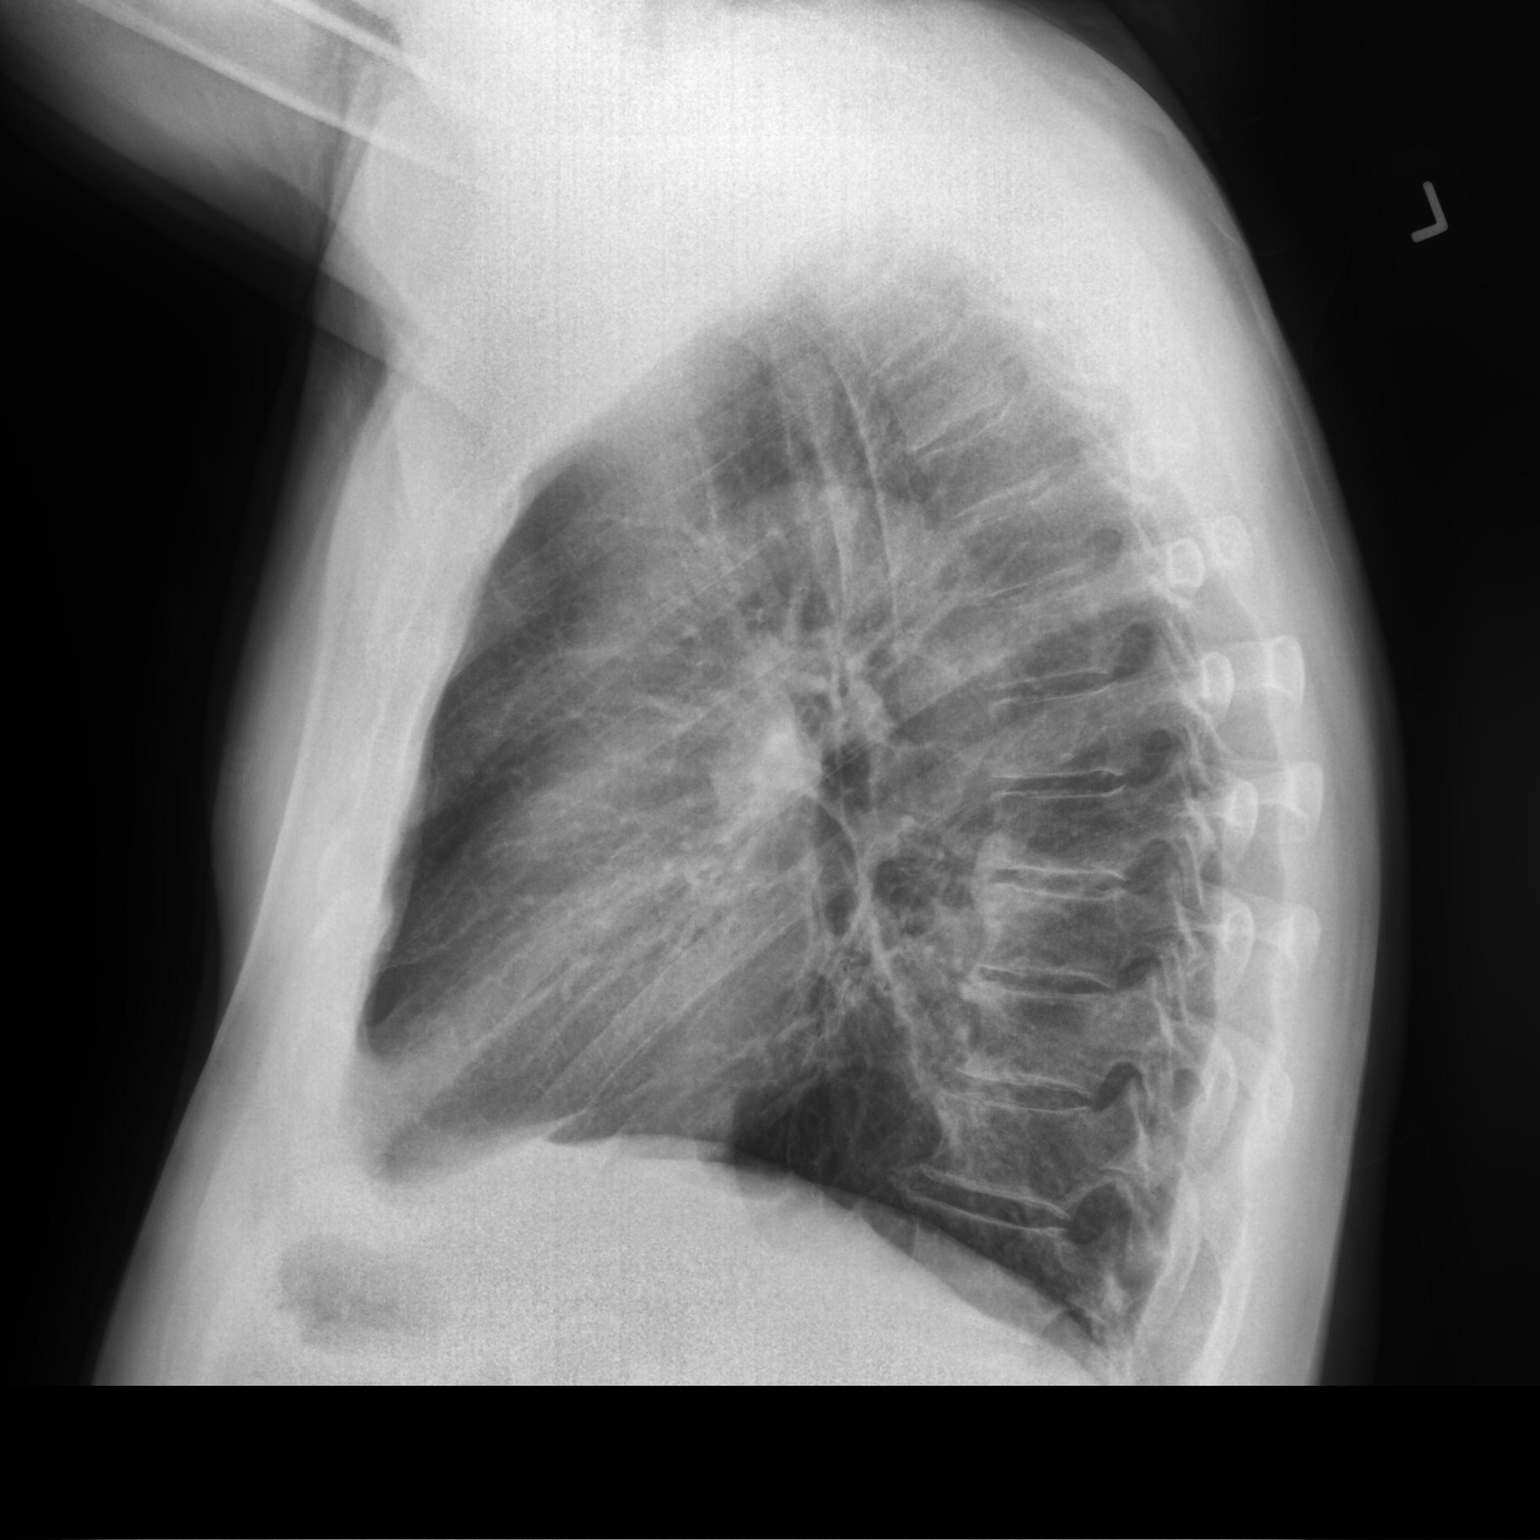

[2 of 2 positions shown; findings below may reference images not displayed]

FINDINGS: The heart size and mediastinal contours are within normal limits.
Both lungs are clear. The visualized skeletal structures are
unremarkable.
IMPRESSION: No active cardiopulmonary disease.

## 2022-07-17 ENCOUNTER — Other Ambulatory Visit: Payer: Self-pay | Admitting: Internal Medicine

## 2022-07-17 DIAGNOSIS — K219 Gastro-esophageal reflux disease without esophagitis: Secondary | ICD-10-CM

## 2022-08-23 ENCOUNTER — Encounter: Payer: Self-pay | Admitting: Emergency Medicine

## 2022-08-23 ENCOUNTER — Ambulatory Visit: Payer: 59 | Admitting: Emergency Medicine

## 2022-08-23 ENCOUNTER — Telehealth: Payer: Self-pay

## 2022-08-23 VITALS — BP 132/76 | HR 79 | Temp 98.6°F | Ht 70.5 in | Wt 201.0 lb

## 2022-08-23 DIAGNOSIS — J4520 Mild intermittent asthma, uncomplicated: Secondary | ICD-10-CM | POA: Diagnosis not present

## 2022-08-23 DIAGNOSIS — J310 Chronic rhinitis: Secondary | ICD-10-CM | POA: Diagnosis not present

## 2022-08-23 DIAGNOSIS — K219 Gastro-esophageal reflux disease without esophagitis: Secondary | ICD-10-CM | POA: Diagnosis not present

## 2022-08-23 MED ORDER — ALBUTEROL SULFATE HFA 108 (90 BASE) MCG/ACT IN AERS: 2.0000 | INHALATION_SPRAY | RESPIRATORY_TRACT | 3 refills | Status: DC | PRN

## 2022-08-23 NOTE — Telephone Encounter (Signed)
Pharmacy could you please run a test claim on Qvar, and if it isn't covered give alternatives that are covered? Thank you

## 2022-08-23 NOTE — Assessment & Plan Note (Signed)
Continue your pantoprazole as you have been using it.

## 2022-08-23 NOTE — Addendum Note (Signed)
Addended by: Dorisann Frames R on: 08/23/2022 09:21 AM   Modules accepted: Orders

## 2022-08-23 NOTE — Assessment & Plan Note (Signed)
We will try stopping your Symbicort and starting you on an alternative maintenance inhaler.  You can continue to use the Symbicort 1 puff twice a day until you get the new one.  Rinse and gargle after using. We will try starting Qvar 40 mcg, 2 puffs twice a day.  Rinse and gargle after using.  We will check with our clinical pharmacist to look for alternatives in case this medication is not well covered by your insurance. We will refill your albuterol today.  Use 2 puffs up to every 4 hours if needed for shortness of breath, chest tightness, wheezing. Follow with Dr. [name] in 12 months or sooner if you have any problems.

## 2022-08-23 NOTE — Patient Instructions (Signed)
We will try stopping your Symbicort and starting you on an alternative maintenance inhaler.  You can continue to use the Symbicort 1 puff twice a day until you get the new one.  Rinse and gargle after using. We will try starting Qvar 40 mcg, 2 puffs twice a day.  Rinse and gargle after using.  We will check with our clinical pharmacist to look for alternatives in case this medication is not well covered by your insurance. We will refill your albuterol today.  Use 2 puffs up to every 4 hours if needed for shortness of breath, chest tightness, wheezing. Continue your fluticasone nasal spray and your loratadine as you have been using them. Continue your pantoprazole as you have been using it. Follow with Dr. [name] in 12 months or sooner if you have any problems.

## 2022-08-23 NOTE — Assessment & Plan Note (Signed)
Continue your fluticasone nasal spray and your loratadine as you have been using them.

## 2022-08-23 NOTE — Progress Notes (Signed)
Subjective:    Patient ID: Brandon Woodward, male    DOB: Oct 03, 1955, 67 y.o.   MRN: [id number]  HPI  ROV 08/07/21 --follow-up visit for 67 year old gentleman with a history of chronic cough, allergic rhinitis with nasal polyposis, GERD.  Also with a remote history of TB as an infant.  He has mild obstruction on pulmonary function testing and we started Symbicort back in December.  Continued pantoprazole, loratadine, fluticasone nasal spray. He is feeling quite well - no cough. On PPI, loratadine, nasal steroid.  No flares.   ROV 08/23/2022 --67 year old man with a history of chronic cough, allergic rhinitis with nasal polyposis, mild intermittent asthma, GERD.  Also remote history of TB as an infant.  Has been managed on pantoprazole, loratadine, fluticasone nasal spray.  Last year we tried changing Symbicort to [employer].  He reports.  Unfortunately Flovent is no longer covered.  He has been using the Symbicort instead now, using 1 puff bid. 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 He will get chest tightness, dyspnea with some of the yard work. Minimal cough.    Review of Systems As per HPI  Past Medical History:  Diagnosis Date   Colon polyps 04/11/2008   Ileocecal valve and hyperplastic (2)   Nephrolithiasis      Family History  Problem Relation Age of Onset   Cervical cancer Mother    Colon cancer Father 66   Prostate cancer Father    Esophageal cancer Neg Hx    Stomach cancer Neg Hx    Rectal cancer Neg Hx      Social History   Socioeconomic History   Marital status: Married    Spouse name: Not on file   Number of children: 2   Years of education: Not on file   Highest education level: Not on file  Occupational History   Occupation: self employed    Employer: [employer]   Tobacco Use   Smoking status: Former    Packs/day: 1.50    Years: 10.00    Additional pack years: 0.00    Total pack years: 15.00    Types: Cigarettes    Quit date: 1993    Years  since quitting: 31.2   Smokeless tobacco: Never  Vaping Use   Vaping Use: Never used  Substance and Sexual Activity   Alcohol use: Yes    Alcohol/week: 4.0 standard drinks of alcohol    Types: 4 Cans of beer per week   Drug use: No   Sexual activity: Not on file  Other Topics Concern   Not on file  Social History Narrative   Not on file   Social Determinants of Health   Financial Resource Strain: Not on file  Food Insecurity: Not on file  Transportation Needs: Not on file  Physical Activity: Not on file  Stress: Not on file  Social Connections: Not on file  Intimate Partner Violence: Not on file     No Known Allergies   Outpatient Medications Prior to Visit  Medication Sig Dispense Refill   albuterol (VENTOLIN HFA) 108 (90 Base) MCG/ACT inhaler Inhale 2 puffs into the lungs every 4 (four) hours as needed.     amLODipine (NORVASC) 2.5 MG tablet Take 2.5 mg by mouth daily.     budesonide-formoterol (SYMBICORT) 160-4.5 MCG/ACT inhaler Inhale 2 puffs into the lungs in the morning and at bedtime. 1 each 6   fluticasone (FLONASE) 50 MCG/ACT nasal spray fluticasone propionate 50 mcg/actuation nasal spray,suspension  USE 2 SPRAYS IN EACH NOSTRIL DAILY     ibuprofen (ADVIL,MOTRIN) 200 MG tablet Take 200 mg by mouth every 6 (six) hours as needed.     meloxicam (MOBIC) 15 MG tablet meloxicam 15 mg tablet     pantoprazole (PROTONIX) 40 MG tablet Take 1 tablet (40 mg total) by mouth daily. Office visit for further refills 90 tablet 0   rosuvastatin (CRESTOR) 5 MG tablet Take 5 mg by mouth daily.     fluticasone (FLOVENT HFA) 110 MCG/ACT inhaler Inhale 2 puffs into the lungs in the morning and at bedtime. (Patient not taking: Reported on 08/23/2022) 1 each 12   No facility-administered medications prior to visit.         Objective:   Physical Exam Vitals:   08/23/22 0847  BP: 132/76  Pulse: 79  Temp: 98.6 F (37 C)  TempSrc: Oral  SpO2: 96%  Weight: 201 lb (91.2 kg)   Height: 5' 10.5" (1.791 m)    Gen: Pleasant, well-nourished, in no distress,  normal affect  ENT: No lesions,  mouth clear,  oropharynx clear, no postnasal drip  Neck: No JVD, no stridor  Lungs: No use of accessory muscles, no crackles or wheezing on normal respiration, no wheeze on forced expiration  Cardiovascular: RRR, heart sounds normal, no murmur or gallops, no peripheral edema  Musculoskeletal: No deformities, no cyanosis or clubbing  Neuro: alert, awake, non focal  Skin: Warm, no lesions or rash      Assessment & Plan:   Mild intermittent asthma We will try stopping your Symbicort and starting you on an alternative maintenance inhaler.  You can continue to use the Symbicort 1 puff twice a day until you get the new one.  Rinse and gargle after using. We will try starting Qvar 40 mcg, 2 puffs twice a day.  Rinse and gargle after using.  We will check with our clinical pharmacist to look for alternatives in case this medication is not well covered by your insurance. We will refill your albuterol today.  Use 2 puffs up to every 4 hours if needed for shortness of breath, chest tightness, wheezing. Follow with Dr. [name] in 12 months or sooner if you have any problems.   Chronic rhinitis Continue your fluticasone nasal spray and your loratadine as you have been using them.  GERD Continue your pantoprazole as you have been using it.   Levy Pupa, MD, PhD 08/23/2022, 9:08 AM [facility] [phone] or if no answer before 7:00PM call [phone] For any issues after 7:00PM please call eLink [phone]

## 2022-08-26 ENCOUNTER — Other Ambulatory Visit (HOSPITAL_COMMUNITY): Payer: Self-pay

## 2022-08-26 NOTE — Telephone Encounter (Signed)
Pulmicort Flexhaler is the preferred alternative.

## 2022-08-27 NOTE — Telephone Encounter (Signed)
Dr [name]  Per pharmacy  Pulmicort Flexhaler is the preferred alternative.   Are you ok if we send in an rx for this   Please advise

## 2022-08-27 NOTE — Telephone Encounter (Signed)
Called and left voicemail regarding new inhaler. Asked patient to call office back

## 2022-08-27 NOTE — Telephone Encounter (Signed)
Yes please order and let him know  > Pulmicort Flexhaler 90 mcg, 2 puffs twice daily

## 2022-10-16 ENCOUNTER — Other Ambulatory Visit: Payer: Self-pay | Admitting: Internal Medicine

## 2022-10-16 DIAGNOSIS — K219 Gastro-esophageal reflux disease without esophagitis: Secondary | ICD-10-CM

## 2022-10-17 ENCOUNTER — Telehealth: Payer: Self-pay | Admitting: Emergency Medicine

## 2022-10-17 NOTE — Telephone Encounter (Signed)
Pt is calling in for a refill for pantoprazole (PROTONIX) 40 MG tablet  , when he was here last they talked about finding a steriod inhaler through his ins but hasn't heard anything back [facility] [address]

## 2022-10-18 ENCOUNTER — Other Ambulatory Visit: Payer: Self-pay | Admitting: *Deleted

## 2022-10-18 MED ORDER — PULMICORT FLEXHALER 90 MCG/ACT IN AEPB: 2.0000 | INHALATION_SPRAY | Freq: Two times a day (BID) | RESPIRATORY_TRACT | 11 refills | Status: DC

## 2022-10-18 NOTE — Telephone Encounter (Signed)
[name] L, RN      08/27/22 12:21 PM Note Called and left voicemail regarding new inhaler. Asked patient to call office back      [name], MD  to [facility] Triage Pool     08/27/22 11:25 AM Note Yes please order and let him know  > Pulmicort Flexhaler 90 mcg, 2 puffs twice daily         Called and spoke with patient, advised that his GI doctor has been prescribing the pantoprazole.  He stated that Dr. [name] told him that he would write for it since he told him to double up on it and he would run out.  He said he does not go to his GI doctor very often and would have to go just to get a script.  I reviewed his last AVS from April of this year and it states to continue the pantoprazole.  Advised that I would send in refills.  I also reviewed the information regarding the alternate inhaler, Pulmicort Flexhaler and that our office did attempt to reach him a couple of times with no success.  Scrips sent in to his preferred pharmacy.  Nothing further needed.        08/27/22 10:25 AM [name], [name], RN routed this conversation to [name], MD   [name], RN      08/27/22 10:25 AM Note Dr [name]   Per pharmacy   Pulmicort Flexhaler is the preferred alternative.    Are you ok if we send in an rx for this    Please advise      August 26, 2022  Manley Mason, CPhT  to [facility] Triage [facility]     08/26/22  1:38 PM Note Pulmicort Flexhaler is the preferred alternative.

## 2022-10-22 ENCOUNTER — Other Ambulatory Visit: Payer: Self-pay | Admitting: Emergency Medicine

## 2022-10-22 DIAGNOSIS — K219 Gastro-esophageal reflux disease without esophagitis: Secondary | ICD-10-CM

## 2022-10-23 ENCOUNTER — Other Ambulatory Visit: Payer: Self-pay

## 2022-10-23 DIAGNOSIS — K219 Gastro-esophageal reflux disease without esophagitis: Secondary | ICD-10-CM

## 2022-10-23 MED ORDER — PANTOPRAZOLE SODIUM 40 MG PO TBEC: 40.0000 mg | DELAYED_RELEASE_TABLET | Freq: Every day | ORAL | 1 refills | Status: DC

## 2022-10-23 NOTE — Telephone Encounter (Signed)
Protonix has been sent to pharmacy. Patient is aware. NFN

## 2022-10-23 NOTE — Telephone Encounter (Signed)
Protonix medication has been sent to pharmacy. NFN

## 2022-10-23 NOTE — Telephone Encounter (Signed)
Pt called back. Please try again. [phone]

## 2023-03-31 ENCOUNTER — Other Ambulatory Visit: Payer: Self-pay | Admitting: Emergency Medicine

## 2023-04-17 ENCOUNTER — Other Ambulatory Visit: Payer: Self-pay | Admitting: Emergency Medicine

## 2023-04-17 DIAGNOSIS — K219 Gastro-esophageal reflux disease without esophagitis: Secondary | ICD-10-CM

## 2023-10-12 ENCOUNTER — Other Ambulatory Visit: Payer: Self-pay | Admitting: Emergency Medicine

## 2023-10-12 DIAGNOSIS — K219 Gastro-esophageal reflux disease without esophagitis: Secondary | ICD-10-CM

## 2023-10-28 ENCOUNTER — Other Ambulatory Visit: Payer: Self-pay | Admitting: Emergency Medicine

## 2023-10-28 DIAGNOSIS — K219 Gastro-esophageal reflux disease without esophagitis: Secondary | ICD-10-CM

## 2023-11-11 ENCOUNTER — Telehealth: Payer: Self-pay | Admitting: Internal Medicine

## 2023-11-11 MED ORDER — PANTOPRAZOLE SODIUM 40 MG PO TBEC: 40.0000 mg | DELAYED_RELEASE_TABLET | Freq: Every day | ORAL | 2 refills | Status: DC

## 2023-11-11 NOTE — Telephone Encounter (Signed)
 Refilled Pantoprazole  but patient needs an office visit

## 2023-11-11 NOTE — Telephone Encounter (Signed)
 Patient called and requested a refill for pantoprazole . Please advise.

## 2024-02-03 ENCOUNTER — Other Ambulatory Visit: Payer: Self-pay | Admitting: Internal Medicine

## 2024-04-06 ENCOUNTER — Other Ambulatory Visit: Payer: Self-pay | Admitting: Emergency Medicine

## 2024-04-19 ENCOUNTER — Telehealth: Payer: Self-pay | Admitting: Internal Medicine

## 2024-04-19 NOTE — Telephone Encounter (Signed)
 The following patient is calling to get a refill on pantoprazole . He is running out and has an OV set for January 15th. [facility] [address].

## 2024-04-20 MED ORDER — PANTOPRAZOLE SODIUM 40 MG PO TBEC: 40.0000 mg | DELAYED_RELEASE_TABLET | Freq: Every day | ORAL | 2 refills | Status: AC

## 2024-04-20 NOTE — Telephone Encounter (Signed)
 Pantoprazole refilled.

## 2024-04-21 ENCOUNTER — Ambulatory Visit: Admitting: Emergency Medicine

## 2024-04-21 ENCOUNTER — Other Ambulatory Visit: Payer: Self-pay | Admitting: Emergency Medicine

## 2024-04-21 ENCOUNTER — Encounter: Payer: Self-pay | Admitting: Emergency Medicine

## 2024-04-21 VITALS — BP 118/60 | HR 75 | Ht 70.5 in | Wt 193.0 lb

## 2024-04-21 DIAGNOSIS — J4520 Mild intermittent asthma, uncomplicated: Secondary | ICD-10-CM

## 2024-04-21 DIAGNOSIS — J209 Acute bronchitis, unspecified: Secondary | ICD-10-CM | POA: Insufficient documentation

## 2024-04-21 DIAGNOSIS — R053 Chronic cough: Secondary | ICD-10-CM

## 2024-04-21 DIAGNOSIS — J310 Chronic rhinitis: Secondary | ICD-10-CM | POA: Diagnosis not present

## 2024-04-21 DIAGNOSIS — K219 Gastro-esophageal reflux disease without esophagitis: Secondary | ICD-10-CM

## 2024-04-21 MED ORDER — AZITHROMYCIN 250 MG PO TABS: ORAL_TABLET | ORAL | 0 refills | Status: DC

## 2024-04-21 MED ORDER — PULMICORT FLEXHALER 90 MCG/ACT IN AEPB: 1.0000 | INHALATION_SPRAY | Freq: Two times a day (BID) | RESPIRATORY_TRACT | 11 refills | Status: AC

## 2024-04-21 NOTE — Assessment & Plan Note (Signed)
 Green sputum in the setting of recent URI.  Will plan to treat with azithromycin and follow for resolution.  He will call me if he is not improving.

## 2024-04-21 NOTE — Assessment & Plan Note (Signed)
 Doing well on the PPI.  He is going to talk to his gastroenterologist about possibly changing to an H2 blocker to avoid side effects.

## 2024-04-21 NOTE — Progress Notes (Signed)
° °  Subjective:    Patient ID: Brandon Woodward, male    DOB: 01-23-1956, 68 y.o.   MRN: [id number]  Asthma His past medical history is significant for asthma.    ROV 08/07/21 --follow-up visit for 68 year old gentleman with a history of chronic cough, allergic rhinitis with nasal polyposis, GERD.  Also with a remote history of TB as an infant.  He has mild obstruction on pulmonary function testing and we started Symbicort  back in December.  Continued pantoprazole , loratadine, fluticasone  nasal spray. He is feeling quite well - no cough. On PPI, loratadine, nasal steroid.  No flares.   ROV 08/23/2022 --68 year old man with a history of chronic cough, allergic rhinitis with nasal polyposis, mild intermittent asthma, GERD.  Also remote history of TB as an infant.  Has been managed on pantoprazole , loratadine, fluticasone  nasal spray.  Last year we tried changing Symbicort  to Flovent .  He reports.  Unfortunately Flovent  is no longer covered.  He has been using the Symbicort  instead now, using 1 puff bid. 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 He will get chest tightness, dyspnea with some of the yard work. Minimal cough.   ROV 04/21/2024 --follow-up visit for Brandon Woodward.  He is a 68 year old gentleman with a history of allergic rhinitis and nasal polyposis with associated mild intermittent asthma and cough.  He also has a history of remote TB as an infant.  Since last visit he has been managed on Pulmicort .  Today he reports that he is overall doing well. He does note that he misses the pulmicort  if he briefly stops it. He uses albuterol  rarely - often when mowing the lawn. He has had some increased cough over the last 2 weeks in setting URI. Green mucous. No overt flares. Remains on PPI, flonase     Review of Systems As per HPI     Objective:   Physical Exam Vitals:   04/21/24 1306  BP: 118/60  Pulse: 75  SpO2: 97%  Weight: 193 lb (87.5 kg)  Height: 5' 10.5 (1.791 m)    Gen: Pleasant,  well-nourished, in no distress,  normal affect  ENT: No lesions,  mouth clear,  oropharynx clear, no postnasal drip  Neck: No JVD, no stridor  Lungs: No use of accessory muscles, no crackles or wheezing on normal respiration, no wheeze on forced expiration  Cardiovascular: RRR, heart sounds normal, no murmur or gallops, no peripheral edema  Musculoskeletal: No deformities, no cyanosis or clubbing  Neuro: alert, awake, non focal  Skin: Warm, no lesions or rash      Assessment & Plan:   Mild intermittent asthma Overall good control on Pulmicort .  No significant flaring although he does have a bronchitis currently in the setting of his recent URI.  Chronic rhinitis Continue his antihistamine and fluticasone  nasal spray  GERD Doing well on the PPI.  He is going to talk to his gastroenterologist about possibly changing to an H2 blocker to avoid side effects.  Chronic cough Stable on his current regimen  Acute bronchitis Green sputum in the setting of recent URI.  Will plan to treat with azithromycin and follow for resolution.  He will call me if he is not improving.     Lamar Chris, MD, PhD 04/21/2024, 1:37 PM [facility] [phone] or if no answer before 7:00PM call [phone] For any issues after 7:00PM please call eLink [phone]

## 2024-04-21 NOTE — Patient Instructions (Signed)
 Please continue Pulmicort  2 puffs twice a day.  Remember to rinse and gargle after using. Keep your albuterol  available to use 2 puffs if needed for shortness of breath, chest tightness, wheezing. Continue your pantoprazole , allergy antihistamine tablet and fluticasone  nasal spray as you have been using them Please take azithromycin as directed until completely gone. Follow in our office in 1 year.  Please call sooner if you have any problems or questions

## 2024-04-21 NOTE — Assessment & Plan Note (Signed)
 Continue his antihistamine and fluticasone  nasal spray

## 2024-04-21 NOTE — Telephone Encounter (Signed)
 There is a shortage of Pulmicort  and likely will not be able to fill this even at alternative pharmacy.  Need to determine a reasonable substitution that is covered by his insurance

## 2024-04-21 NOTE — Assessment & Plan Note (Signed)
 Overall good control on Pulmicort .  No significant flaring although he does have a bronchitis currently in the setting of his recent URI.

## 2024-04-21 NOTE — Assessment & Plan Note (Signed)
Stable on his current regimen 

## 2024-04-22 ENCOUNTER — Telehealth: Payer: Self-pay

## 2024-04-22 ENCOUNTER — Other Ambulatory Visit (HOSPITAL_COMMUNITY): Payer: Self-pay

## 2024-04-22 NOTE — Telephone Encounter (Signed)
 Covered alternatives to Pulmicort  (medication is on possible backorder)  Alvesco- $295.00 Asmanex HFA (preferred)- $25.00

## 2024-04-23 MED ORDER — ASMANEX HFA 100 MCG/ACT IN AERO: 2.0000 | INHALATION_SPRAY | Freq: Two times a day (BID) | RESPIRATORY_TRACT | 11 refills | Status: DC

## 2024-04-23 NOTE — Telephone Encounter (Signed)
 If Signe is willing to do so lets go with the Asmanex - I send prescription

## 2024-04-23 NOTE — Addendum Note (Signed)
 Addended by: SHELAH LAMAR RAMAN on: 04/23/2024 12:28 PM   Modules accepted: Orders

## 2024-04-23 NOTE — Telephone Encounter (Signed)
 Please advise Dr. [name]

## 2024-04-23 NOTE — Telephone Encounter (Signed)
 Pt is aware Asmanex was sent to pharmacy and prefers to use inhaler instead. nothing further needed.

## 2024-04-26 ENCOUNTER — Other Ambulatory Visit: Payer: Self-pay | Admitting: Emergency Medicine

## 2024-04-27 NOTE — Telephone Encounter (Signed)
Any info on this?

## 2024-04-27 NOTE — Telephone Encounter (Signed)
 Nothing further is needed here. See encounter dated 11/12 pt is using Asmanex  since Pulmicort  is on backorder.

## 2024-04-27 NOTE — Telephone Encounter (Signed)
 We were going to choose an alternative for him since the Pulmicort  is on backorder.  Can we confirm that this has been done?  Thank you

## 2024-04-29 ENCOUNTER — Other Ambulatory Visit: Payer: Self-pay | Admitting: Emergency Medicine

## 2024-04-29 NOTE — Telephone Encounter (Signed)
 Is asmanex  on backorder also??

## 2024-04-29 NOTE — Telephone Encounter (Signed)
 Called and spoke with the pharmacy and they advised that Asmanex  100 is on backorder.  Pharmacy stated pt picked up rx for pulmicort  this morning. I advised pharmacy to cancel rx for Asmanex .  Everything is sorted out now, nothing further needed.

## 2024-05-27 ENCOUNTER — Ambulatory Visit: Admitting: Gastroenterology

## 2024-05-27 ENCOUNTER — Encounter: Payer: Self-pay | Admitting: Gastroenterology

## 2024-05-27 VITALS — BP 152/80 | HR 78 | Ht 70.5 in | Wt 197.0 lb

## 2024-05-27 DIAGNOSIS — Z86018 Personal history of other benign neoplasm: Secondary | ICD-10-CM | POA: Diagnosis not present

## 2024-05-27 DIAGNOSIS — K219 Gastro-esophageal reflux disease without esophagitis: Secondary | ICD-10-CM | POA: Diagnosis not present

## 2024-05-27 DIAGNOSIS — Z860101 Personal history of adenomatous and serrated colon polyps: Secondary | ICD-10-CM

## 2024-05-27 DIAGNOSIS — Z860100 Personal history of colon polyps, unspecified: Secondary | ICD-10-CM

## 2024-05-27 DIAGNOSIS — D132 Benign neoplasm of duodenum: Secondary | ICD-10-CM

## 2024-05-27 MED ORDER — PANTOPRAZOLE SODIUM 20 MG PO TBEC: 20.0000 mg | DELAYED_RELEASE_TABLET | Freq: Every day | ORAL | 3 refills | Status: AC

## 2024-05-27 NOTE — Progress Notes (Signed)
 "  Chief Complaint: med refill Primary GI MD: Dr. abran  HPI: Discussed the use of AI scribe software for clinical note transcription with the patient, who gave verbal consent to proceed.  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  Pantoprazole  40 mg once daily has provided adequate control of reflux symptoms. He has not attempted dose reduction but is open to trying 20 mg daily. He expressed interest in potential side effects of pantoprazole , referencing concerns from his pulmonary doctor about risks such as chronic kidney disease and osteoporosis.  An endoscopy in October 2022 for dysphagia revealed a precancerous duodenal polyp and required esophageal dilation. A repeat endoscopy was recommended six months later but has not been completed. He is considering scheduling the procedure in the spring when it is more convenient.  Meloxicam is used intermittently for hand arthritis pain, limited to two days at a time due to gastrointestinal concerns. He attributes some hand pain to his cholesterol medication and has discussed alternate dosing with his physician.   PREVIOUS GI WORKUP   Colonoscopy 01/2021 with 3 tubular adenomas and 1 sessile serrated polyp - Repeat 01/2026  EGD 02/2021 for dysphagia - Benign peptic stricture dilated with 54 French Maloney dilator - Incidental small duodenal polypoid lesion which was removed and biopsied as a tubular adenoma.  Area marked with a tattoo.  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     Budesonide  (PULMICORT  FLEXHALER) 90 MCG/ACT inhaler Inhale 1 puff into the lungs 2 (two) times daily. 1 each 11   fluticasone  (FLONASE ) 50 MCG/ACT nasal spray fluticasone  propionate 50 mcg/actuation nasal spray,suspension  USE 2 SPRAYS IN EACH NOSTRIL DAILY     ibuprofen (ADVIL,MOTRIN) 200 MG tablet Take 200 mg by mouth every 6 (six) hours as needed.     meloxicam (MOBIC) 15 MG tablet meloxicam 15 mg tablet (Patient taking differently: Take 15 mg by mouth as needed.)     pantoprazole  (PROTONIX ) 20 MG tablet Take 1 tablet (20 mg total) by mouth daily. 90 tablet 3   pantoprazole  (PROTONIX ) 40 MG tablet Take 1 tablet (40 mg total) by mouth daily. OFFICE VISIT FOR FURTHER REFILLS 30 tablet 2   rosuvastatin (CRESTOR) 5 MG tablet Take 5 mg by mouth daily.     No current facility-administered medications for this visit.    Allergies as of 05/27/2024   (No Known Allergies)    Family History  Problem Relation Age of Onset   Cervical cancer Mother    Colon cancer Father 90   Prostate cancer Father    Esophageal cancer Neg Hx    Stomach cancer Neg Hx    Rectal cancer Neg Hx     Social History  Socioeconomic History   Marital status: Married    Spouse name: Not on file   Number of children: 2   Years of education: Not on file   Highest education level: Not on file  Occupational History   Occupation: self employed    Employer: [employer]   Tobacco Use   Smoking status: Former    Current packs/day: 0.00    Average packs/day: 1.5 packs/day for 10.0 years (15.0 ttl pk-yrs)    Types: Cigarettes    Start date: 70    Quit date: 1993    Years since quitting: 33.0   Smokeless tobacco: Never  Vaping  Use   Vaping status: Never Used  Substance and Sexual Activity   Alcohol use: Yes    Alcohol/week: 4.0 standard drinks of alcohol    Types: 4 Cans of beer per week    Comment: occ   Drug use: No   Sexual activity: Not on file  Other Topics Concern   Not on file  Social History Narrative   Not on file   Social Drivers of Health   Tobacco Use: Medium Risk (05/27/2024)   Patient History    Smoking Tobacco Use: Former    Smokeless Tobacco Use: Never    Passive Exposure: Not on Actuary Strain: Not on file  Food Insecurity: Not on file  Transportation Needs: Not on file  Physical Activity: Not on file  Stress: Not on file  Social Connections: Not on file  Intimate Partner Violence: Not on file  Depression (PHQ2-9): Not on file  Alcohol Screen: Not on file  Housing: Not on file  Utilities: Not on file  Health Literacy: Not on file    Review of Systems:    Constitutional: No weight loss, fever, chills, weakness or fatigue HEENT: Eyes: No change in vision               Ears, Nose, Throat:  No change in hearing or congestion Skin: No rash or itching Cardiovascular: No chest pain, chest pressure or palpitations   Respiratory: No SOB or cough Gastrointestinal: See HPI and otherwise negative Genitourinary: No dysuria or change in urinary frequency Neurological: No headache, dizziness or syncope Musculoskeletal: No new muscle or joint pain Hematologic: No bleeding or bruising Psychiatric: No history of depression or anxiety    Physical Exam:  Vital signs: BP (!) 152/80   Pulse 78   Ht 5' 10.5 (1.791 m)   Wt 197 lb (89.4 kg)   BMI 27.87 kg/m   Constitutional: NAD, alert and cooperative Head:  Normocephalic and atraumatic. Eyes:   PEERL, EOMI. No icterus. Conjunctiva pink. Respiratory: Respirations even and unlabored. Lungs clear to auscultation bilaterally.   No wheezes, crackles, or rhonchi.  Cardiovascular:  Regular rate and rhythm. No peripheral  edema, cyanosis or pallor.  Gastrointestinal:  Soft, nondistended, nontender. No rebound or guarding. Normal bowel sounds. No appreciable masses or hepatomegaly. Rectal:  Declines Msk:  Symmetrical without gross deformities. Without edema, no deformity or joint abnormality.  Neurologic:  Alert and  oriented x4;  grossly normal neurologically.  Skin:   Dry and intact without significant lesions or rashes. Psychiatric: Oriented to person, place and time. Demonstrates good judgement and reason without abnormal affect or behaviors.  Physical Exam    RELEVANT LABS AND IMAGING: CBC    Component Value Date/Time   WBC 10.0 11/20/2012 1505   RBC 5.12 11/20/2012 1505   HGB 15.5 11/20/2012 1505   HCT 45.5 11/20/2012  1505   PLT 252.0 11/20/2012 1505   MCV 88.9 11/20/2012 1505   MCH 30.3 07/06/2011 1515   MCHC 34.0 11/20/2012 1505   RDW 13.0 11/20/2012 1505   LYMPHSABS 1.7 11/20/2012 1505   MONOABS 0.5 11/20/2012 1505   EOSABS 0.1 11/20/2012 1505   BASOSABS 0.0 11/20/2012 1505    CMP     Component Value Date/Time   NA 138 07/06/2011 1515   K 3.5 07/06/2011 1515   CL 102 07/06/2011 1515   CO2 26 07/06/2011 1515   GLUCOSE 105 (H) 07/06/2011 1515   BUN 15 07/06/2011 1515   CREATININE 0.92 07/06/2011 1515   CALCIUM 9.6 07/06/2011 1515   GFRNONAA >90 07/06/2011 1515   GFRAA >90 07/06/2011 1515     Assessment/Plan:   GERD EGD 02/2021 negative for Barrett's esophagus.  Longstanding history of GERD controlled on pantoprazole  40 mg once daily -- decrease to pantoprazole  20mg . 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  Recommended EGD follow-up 6 months but this was never  -- EGD for further evaluation -- patient requests to schedule in April when he has transportation available -- I thoroughly  discussed the procedure with the patient (at bedside) to include nature of the procedure, alternatives, benefits, and risks (including but not limited to bleeding, infection, perforation, anesthesia/cardiac pulmonary complications).  Patient verbalized understanding and gave verbal consent to proceed with procedure.   History of tubular adenomas Colonoscopy 01/2021 with 3 tubular adenomas and 1 sessile serrated polyp with recall 5 years - Repeat 02/2026    Nestor Blower, DEVONNA [facility] 05/27/2024, 12:07 PM  Cc: Regino Slater, MD "

## 2024-05-27 NOTE — Progress Notes (Signed)
 Noted

## 2024-05-27 NOTE — Patient Instructions (Addendum)
 We have sent the following medications to your pharmacy for you to pick up at your convenience:  Pantoprazole  20mg   _______________________________________________________  If your blood pressure at your visit was 140/90 or greater, please contact your primary care physician to follow up on this.  _______________________________________________________  If you are age 71 or older, your body mass index should be between 23-30. Your Body mass index is 27.87 kg/m. If this is out of the aforementioned range listed, please consider follow up with your Primary Care Provider.  If you are age 22 or younger, your body mass index should be between 19-25. Your Body mass index is 27.87 kg/m. If this is out of the aformentioned range listed, please consider follow up with your Primary Care Provider.   ________________________________________________________  The [facility] providers would like to encourage you to use MYCHART to communicate with providers for non-urgent requests or questions.  Due to long hold times on the telephone, sending your provider a message by [facility] may be a faster and more efficient way to get a response.  Please allow 48 business hours for a response.  Please remember that this is for non-urgent requests.  _______________________________________________________  [facility] is using a team-based approach to care.  Your team is made up of your doctor and two to three APPS. Our APPS (Nurse Practitioners and Physician Assistants) work with your physician to ensure care continuity for you. They are fully qualified to address your health concerns and develop a treatment plan. They communicate directly with your gastroenterologist to care for you. Seeing the Advanced Practice Practitioners on your physician's team can help you by facilitating care more promptly, often allowing for earlier appointments, access to diagnostic testing, procedures, and other specialty referrals.
